# Patient Record
Sex: Female | Born: 1975 | Race: Black or African American | Hispanic: No | Marital: Single | Smoking: Current every day smoker
Health system: Southern US, Community
[De-identification: ages and names within clinical notes are randomized; demographics above are authoritative.]

## PROBLEM LIST (undated history)

## (undated) DIAGNOSIS — E282 Polycystic ovarian syndrome: Secondary | ICD-10-CM

## (undated) DIAGNOSIS — IMO0002 Reserved for concepts with insufficient information to code with codable children: Secondary | ICD-10-CM

## (undated) DIAGNOSIS — D649 Anemia, unspecified: Secondary | ICD-10-CM

## (undated) DIAGNOSIS — L309 Dermatitis, unspecified: Secondary | ICD-10-CM

## (undated) DIAGNOSIS — N920 Excessive and frequent menstruation with regular cycle: Secondary | ICD-10-CM

## (undated) DIAGNOSIS — L02416 Cutaneous abscess of left lower limb: Secondary | ICD-10-CM

## (undated) DIAGNOSIS — N946 Dysmenorrhea, unspecified: Secondary | ICD-10-CM

## (undated) DIAGNOSIS — D500 Iron deficiency anemia secondary to blood loss (chronic): Secondary | ICD-10-CM

## (undated) DIAGNOSIS — K219 Gastro-esophageal reflux disease without esophagitis: Secondary | ICD-10-CM

## (undated) DIAGNOSIS — Z789 Other specified health status: Secondary | ICD-10-CM

## (undated) DIAGNOSIS — D259 Leiomyoma of uterus, unspecified: Secondary | ICD-10-CM

---

## 2005-11-01 ENCOUNTER — Emergency Department (HOSPITAL_COMMUNITY): Admission: EM | Admit: 2005-11-01 | Discharge: 2005-11-01 | Payer: Self-pay | Admitting: Family Medicine

## 2006-07-04 ENCOUNTER — Emergency Department (HOSPITAL_COMMUNITY): Admission: EM | Admit: 2006-07-04 | Discharge: 2006-07-04 | Payer: Self-pay | Admitting: Family Medicine

## 2007-01-30 ENCOUNTER — Ambulatory Visit (HOSPITAL_COMMUNITY): Admission: RE | Admit: 2007-01-30 | Discharge: 2007-01-30 | Payer: Self-pay | Admitting: *Deleted

## 2007-01-30 ENCOUNTER — Encounter (INDEPENDENT_AMBULATORY_CARE_PROVIDER_SITE_OTHER): Payer: Self-pay | Admitting: *Deleted

## 2007-01-30 HISTORY — PX: HYSTEROSCOPY WITH D & C: SHX1775

## 2007-07-27 HISTORY — PX: DILATION AND CURETTAGE OF UTERUS: SHX78

## 2008-01-02 ENCOUNTER — Other Ambulatory Visit: Admission: RE | Admit: 2008-01-02 | Discharge: 2008-01-02 | Payer: Self-pay | Admitting: Gynecology

## 2008-02-13 ENCOUNTER — Emergency Department (HOSPITAL_COMMUNITY): Admission: EM | Admit: 2008-02-13 | Discharge: 2008-02-13 | Payer: Self-pay | Admitting: Emergency Medicine

## 2008-10-21 ENCOUNTER — Emergency Department (HOSPITAL_COMMUNITY): Admission: EM | Admit: 2008-10-21 | Discharge: 2008-10-21 | Payer: Self-pay | Admitting: Family Medicine

## 2009-07-16 ENCOUNTER — Emergency Department (HOSPITAL_COMMUNITY): Admission: EM | Admit: 2009-07-16 | Discharge: 2009-07-16 | Payer: Self-pay | Admitting: Family Medicine

## 2010-07-26 HISTORY — PX: UTERINE FIBROID SURGERY: SHX826

## 2010-10-26 LAB — POCT I-STAT, CHEM 8
Calcium, Ion: 1.22 mmol/L (ref 1.12–1.32)
Glucose, Bld: 88 mg/dL (ref 70–99)
HCT: 36 % (ref 36.0–46.0)
Hemoglobin: 12.2 g/dL (ref 12.0–15.0)
Potassium: 3.7 mEq/L (ref 3.5–5.1)

## 2010-10-26 LAB — DIFFERENTIAL
Basophils Relative: 0 % (ref 0–1)
Eosinophils Absolute: 0.1 10*3/uL (ref 0.0–0.7)
Monocytes Relative: 6 % (ref 3–12)
Neutrophils Relative %: 63 % (ref 43–77)

## 2010-10-26 LAB — CBC
HCT: 32.3 % — ABNORMAL LOW (ref 36.0–46.0)
Hemoglobin: 10.2 g/dL — ABNORMAL LOW (ref 12.0–15.0)
MCHC: 31.4 g/dL (ref 30.0–36.0)
MCV: 70.2 fL — ABNORMAL LOW (ref 78.0–100.0)
Platelets: 396 K/uL (ref 150–400)
RBC: 4.61 MIL/uL (ref 3.87–5.11)
RDW: 20.3 % — ABNORMAL HIGH (ref 11.5–15.5)
WBC: 8.6 K/uL (ref 4.0–10.5)

## 2010-10-26 LAB — POCT URINALYSIS DIP (DEVICE)
Glucose, UA: NEGATIVE mg/dL
Nitrite: NEGATIVE
Protein, ur: 30 mg/dL — AB
Specific Gravity, Urine: 1.015 (ref 1.005–1.030)
Urobilinogen, UA: 0.2 mg/dL (ref 0.0–1.0)
pH: 6 (ref 5.0–8.0)

## 2010-11-05 LAB — POCT URINALYSIS DIP (DEVICE)
Bilirubin Urine: NEGATIVE
Nitrite: NEGATIVE
Protein, ur: NEGATIVE mg/dL
pH: 7 (ref 5.0–8.0)

## 2010-12-08 NOTE — Op Note (Signed)
NAME:  Sally Price, Sally Price            ACCOUNT NO.:  [id number]   MEDICAL RECORD NO.:  [id number]          PATIENT TYPE:  AMB   LOCATION:  DAY                          FACILITY:  [facility] [place]   PHYSICIAN:  Almedia Balls. Fore, M.D.   DATE OF BIRTH:  [birth year]   DATE OF PROCEDURE:  01/30/2007  DATE OF DISCHARGE:                               OPERATIVE REPORT   PREOPERATIVE DIAGNOSIS:  Abnormal uterine bleeding.   POSTOPERATIVE DIAGNOSIS:  Abnormal uterine bleeding, pending pathology.   OPERATION:  Diagnostic hysteroscopy, fractional D&C.   ANESTHESIA:  General orotracheal, with 10 mL 1% lidocaine with 1:100,000  epinephrine paracervical block.   INDICATIONS FOR SURGERY:  The patient is a 35 year old with persistent  abnormal uterine bleeding over the past 6 months.  She presented in our  office today with bleeding and a grayish mass protruding through the  cervix.  Because of her discomfort, it was felt that she should undergo  D&C.  She was fully counseled as to the nature of the procedure and the  risks involved to include risks of anesthesia, injury to the uterus,  tubes, ovaries, bowel, bladder blood vessels, ureters, postoperative  hemorrhage, infection, and recuperation.  She fully understands all  these considerations and has signed informed consent to proceed on January 30, 2007.   OPERATIVE FINDINGS:  On placement of the speculum, there was a grayish,  somewhat friable, soft mass protruding through the cervix which probably  represented old blood clot.  The cervix was dilated to approximately 1.5  cm.  The endocervical canal was clean.  The uterus sounded to 8.5 cm.  Endometrial cavity had numerous areas of shaggy-appearing endometrium  present.  On bimanual exam, the uterus was midposterior, approximately [redacted]  weeks gestational size.  There were no palpable adnexal masses.   PROCEDURE:  With the patient under general anesthesia, prepared and  draped in th usual sterile fashion, a speculum  was placed in the vagina.  A single-tooth tenaculum was placed on the cervix, and a solution of 1%  lidocaine with 1:100,000 epinephrine was injected circumferentially on  the cervix for paracervical block.  Small sharp curette was used for  curettage of the endocervical canal after the protruding mass was  removed.  The uterus was then sounded as noted above, and because no  dilation of the cervix was necessary, the hysteroscope was introduced  using free flow of [place] and direct vision, with the above-noted  findings.  The hysteroscope was then removed, and a medium sharp curette  and polyp forceps were used for removal of tissue from the endometrial  cavity.  Hysteroscope was re-employed to ensure that all tissue had been  removed. After noting that this was the case, and hemostasis was  maintained, and the sponge and instrument counts were correct, the  procedure was terminated.  Estimated blood loss was approximately 25 mL.  The patient was taken to the recovery room in good condition.   FOLLOW-UP CARE:  She is to return the office in 2 weeks for followup,  and to call if heavy bleeding, pain, or unexplained fever should ensue.  She was given a prescription for Darvocet-N 100 generic, #10, to be  taken 1/2 to 1 q.6 h. p.r.n. pain, and doxycycline 100 mg, #12, to be  taken 1 b.i.d..           ______________________________  Almedia Balls Randell Patient, M.D.     SRF/MEDQ  D:  01/30/2007  T:  01/31/2007  Job:  [id number]   cc:   Leatha Gilding. Mezer, M.D.  Fax: [phone]

## 2011-01-11 ENCOUNTER — Inpatient Hospital Stay (INDEPENDENT_AMBULATORY_CARE_PROVIDER_SITE_OTHER)
Admission: RE | Admit: 2011-01-11 | Discharge: 2011-01-11 | Disposition: A | Discharge status: Home / Self Care | Payer: 59 | Source: Ambulatory Visit | Attending: Emergency Medicine | Admitting: Emergency Medicine

## 2011-01-11 DIAGNOSIS — H60399 Other infective otitis externa, unspecified ear: Secondary | ICD-10-CM

## 2011-01-13 LAB — CULTURE, ROUTINE-ABSCESS

## 2011-04-23 LAB — POCT URINALYSIS DIP (DEVICE)
Glucose, UA: NEGATIVE
Nitrite: POSITIVE — AB
Operator id: 247071
Urobilinogen, UA: 0.2

## 2011-04-23 LAB — POCT PREGNANCY, URINE: Operator id: 247071

## 2011-04-23 LAB — URINE CULTURE

## 2011-04-27 ENCOUNTER — Encounter (HOSPITAL_COMMUNITY): Payer: Self-pay | Admitting: *Deleted

## 2011-05-03 ENCOUNTER — Other Ambulatory Visit: Payer: Self-pay | Admitting: Obstetrics and Gynecology

## 2011-05-11 LAB — HEMOGLOBIN AND HEMATOCRIT, BLOOD
HCT: 34.9 — ABNORMAL LOW
Hemoglobin: 11.5 — ABNORMAL LOW

## 2011-05-12 NOTE — H&P (Signed)
NAME:  Sally Price, Sally Price NO.:  [id number]  MEDICAL RECORD NO.:  [id number]  LOCATION:  URG                          FACILITY:  [facility]  PHYSICIAN:  Lenoard Aden, M.D.DATE OF BIRTH:  1976/04/10  DATE OF ADMISSION:  01/11/2011 DATE OF DISCHARGE:  01/11/2011                             HISTORY & PHYSICAL   CHIEF COMPLAINT:  Dysfunctional uterine bleeding with structural endometrial lesion.  HISTORY OF PRESENT ILLNESS:  She is a 35 year old African American female, G0, P0 with irregular uterine bleeding and questionable submucous fibroid versus polyp on saline sonohysterography.  ALLERGIES:  She has no known drug allergies.  MEDICATIONS:  Birth control pills.  FAMILY HISTORY:  Myocardial infarction, heart failure, diabetes, and hypertension.  SOCIAL HISTORY:  A cigarette per day smoker.  Nondrinker and denies domestic or physical violence.  PAST SURGICAL HISTORY:  D and C in 2009.  PHYSICAL EXAMINATION:  GENERAL:  Well-developed, well-nourished African American female in no acute distress. HEENT:  Normal. NECK:  Supple.  Full range of motion. LUNGS:  Clear. HEART:  Regular rhythm. ABDOMEN:  Soft, obese, nontender. PELVIC:  A retroverted uterus and no adnexal masses.  IMPRESSION:  Dysfunctional uterine bleeding with structural lesion.  PLAN:  Proceed with diagnostic hysteroscopy D and C, possible risk for resection, risks of anesthesia, infection, bleeding, injury to abdominal organs, need for repair was discussed.  Delayed versus immediate complications to include bowel and bladder injury noted.  Possibility and/or inability to cure all bleeding at this time.  The patient acknowledges and wishes to proceed.     Lenoard Aden, M.D.     RJT/MEDQ  D:  05/12/2011  T:  05/12/2011  Job:  [id number]

## 2011-05-13 ENCOUNTER — Encounter (HOSPITAL_COMMUNITY): Payer: Self-pay | Admitting: Anesthesiology

## 2011-05-13 ENCOUNTER — Encounter (HOSPITAL_COMMUNITY): Payer: Self-pay | Admitting: *Deleted

## 2011-05-13 ENCOUNTER — Ambulatory Visit (HOSPITAL_COMMUNITY): Payer: 59 | Admitting: Anesthesiology

## 2011-05-13 ENCOUNTER — Encounter (HOSPITAL_COMMUNITY)
Admission: RE | Disposition: A | Discharge status: Home / Self Care | Payer: Self-pay | Source: Ambulatory Visit | Attending: Obstetrics and Gynecology

## 2011-05-13 ENCOUNTER — Ambulatory Visit (HOSPITAL_COMMUNITY)
Admission: RE | Admit: 2011-05-13 | Discharge: 2011-05-13 | Disposition: A | Discharge status: Home / Self Care | Payer: 59 | Source: Ambulatory Visit | Attending: Obstetrics and Gynecology | Admitting: Obstetrics and Gynecology

## 2011-05-13 ENCOUNTER — Other Ambulatory Visit: Payer: Self-pay | Admitting: Obstetrics and Gynecology

## 2011-05-13 DIAGNOSIS — N938 Other specified abnormal uterine and vaginal bleeding: Secondary | ICD-10-CM | POA: Insufficient documentation

## 2011-05-13 DIAGNOSIS — N840 Polyp of corpus uteri: Secondary | ICD-10-CM | POA: Insufficient documentation

## 2011-05-13 DIAGNOSIS — N949 Unspecified condition associated with female genital organs and menstrual cycle: Secondary | ICD-10-CM | POA: Insufficient documentation

## 2011-05-13 HISTORY — DX: Other specified health status: Z78.9

## 2011-05-13 HISTORY — PX: HYSTEROSCOPY WITH D & C: SHX1775

## 2011-05-13 LAB — HCG, SERUM, QUALITATIVE: Preg, Serum: NEGATIVE

## 2011-05-13 LAB — CBC
Hemoglobin: 11.2 g/dL — ABNORMAL LOW (ref 12.0–15.0)
MCH: 23.3 pg — ABNORMAL LOW (ref 26.0–34.0)
MCHC: 30.3 g/dL (ref 30.0–36.0)

## 2011-05-13 SURGERY — DILATATION AND CURETTAGE /HYSTEROSCOPY
Anesthesia: General | Site: Vagina | Wound class: Clean Contaminated

## 2011-05-13 MED ORDER — FENTANYL CITRATE 0.05 MG/ML IJ SOLN
25.0000 ug | INTRAMUSCULAR | Status: DC | PRN
  Administered 2011-05-13: 50 ug via INTRAVENOUS

## 2011-05-13 MED ORDER — LACTATED RINGERS IV SOLN
INTRAVENOUS | Status: DC
  Administered 2011-05-13 (×2): via INTRAVENOUS

## 2011-05-13 MED ORDER — FENTANYL CITRATE 0.05 MG/ML IJ SOLN
INTRAMUSCULAR | Status: DC | PRN
  Administered 2011-05-13: 100 ug via INTRAVENOUS

## 2011-05-13 MED ORDER — DEXAMETHASONE SODIUM PHOSPHATE 10 MG/ML IJ SOLN
INTRAMUSCULAR | Status: DC | PRN
  Administered 2011-05-13: 10 mg via INTRAVENOUS

## 2011-05-13 MED ORDER — MIDAZOLAM HCL 5 MG/5ML IJ SOLN
INTRAMUSCULAR | Status: DC | PRN
  Administered 2011-05-13: 2 mg via INTRAVENOUS

## 2011-05-13 MED ORDER — BUPIVACAINE HCL (PF) 0.25 % IJ SOLN
INTRAMUSCULAR | Status: DC | PRN
  Administered 2011-05-13: 20 mL

## 2011-05-13 MED ORDER — FENTANYL CITRATE 0.05 MG/ML IJ SOLN
INTRAMUSCULAR | Status: AC
  Filled 2011-05-13: qty 2

## 2011-05-13 MED ORDER — VASOPRESSIN 20 UNIT/ML IJ SOLN
INTRAVENOUS | Status: DC | PRN
  Administered 2011-05-13: 10:00:00 via INTRAMUSCULAR

## 2011-05-13 MED ORDER — KETOROLAC TROMETHAMINE 30 MG/ML IJ SOLN: 15.0000 mg | Freq: Once | INTRAMUSCULAR | Status: DC | PRN

## 2011-05-13 MED ORDER — FENTANYL CITRATE 0.05 MG/ML IJ SOLN
INTRAMUSCULAR | Status: AC
  Administered 2011-05-13: 50 ug via INTRAVENOUS
  Filled 2011-05-13: qty 2

## 2011-05-13 MED ORDER — DEXAMETHASONE SODIUM PHOSPHATE 10 MG/ML IJ SOLN
INTRAMUSCULAR | Status: AC
  Filled 2011-05-13: qty 1

## 2011-05-13 MED ORDER — LIDOCAINE HCL (CARDIAC) 20 MG/ML IV SOLN
INTRAVENOUS | Status: AC
  Filled 2011-05-13: qty 5

## 2011-05-13 MED ORDER — TRAMADOL HCL 50 MG PO TABS: 50.0000 mg | ORAL_TABLET | Freq: Four times a day (QID) | ORAL | Status: AC | PRN

## 2011-05-13 MED ORDER — HYDROCODONE-ACETAMINOPHEN 5-325 MG PO TABS
1.0000 | ORAL_TABLET | Freq: Once | ORAL | Status: AC
  Administered 2011-05-13: 1 via ORAL

## 2011-05-13 MED ORDER — KETOROLAC TROMETHAMINE 30 MG/ML IJ SOLN
INTRAMUSCULAR | Status: AC
  Filled 2011-05-13: qty 1

## 2011-05-13 MED ORDER — ONDANSETRON HCL 4 MG/2ML IJ SOLN
INTRAMUSCULAR | Status: DC | PRN
  Administered 2011-05-13: 4 mg via INTRAVENOUS

## 2011-05-13 MED ORDER — LIDOCAINE HCL (CARDIAC) 20 MG/ML IV SOLN
INTRAVENOUS | Status: DC | PRN
  Administered 2011-05-13: 80 mg via INTRAVENOUS

## 2011-05-13 MED ORDER — KETOROLAC TROMETHAMINE 30 MG/ML IJ SOLN
INTRAMUSCULAR | Status: DC | PRN
  Administered 2011-05-13: 30 mg via INTRAVENOUS

## 2011-05-13 MED ORDER — MIDAZOLAM HCL 2 MG/2ML IJ SOLN
INTRAMUSCULAR | Status: AC
  Filled 2011-05-13: qty 2

## 2011-05-13 MED ORDER — PROPOFOL 10 MG/ML IV EMUL
INTRAVENOUS | Status: DC | PRN
  Administered 2011-05-13: 200 mg via INTRAVENOUS

## 2011-05-13 MED ORDER — PROPOFOL 10 MG/ML IV EMUL
INTRAVENOUS | Status: AC
  Filled 2011-05-13: qty 20

## 2011-05-13 MED ORDER — HYDROCODONE-ACETAMINOPHEN 5-325 MG PO TABS
ORAL_TABLET | ORAL | Status: AC
  Filled 2011-05-13: qty 1

## 2011-05-13 MED ORDER — ONDANSETRON HCL 4 MG/2ML IJ SOLN
INTRAMUSCULAR | Status: AC
  Filled 2011-05-13: qty 2

## 2011-05-13 MED ORDER — SODIUM CHLORIDE 0.9 % IR SOLN
Status: DC | PRN
  Administered 2011-05-13: 3000 mL

## 2011-05-13 SURGICAL SUPPLY — 13 items
CANISTER SUCTION 2500CC (MISCELLANEOUS) ×3 IMPLANT
CATH ROBINSON RED A/P 16FR (CATHETERS) ×3 IMPLANT
CLOTH BEACON ORANGE TIMEOUT ST (SAFETY) ×3 IMPLANT
CONTAINER PREFILL 10% NBF 60ML (FORM) ×4 IMPLANT
DRAPE UTILITY XL STRL (DRAPES) ×3 IMPLANT
ELECTRODE RT ANGLE VERSAPOINT (CUTTING LOOP) ×3 IMPLANT
GLOVE BIO SURGEON STRL SZ7.5 (GLOVE) ×6 IMPLANT
GOWN STRL NON-REIN LRG LVL3 (GOWN DISPOSABLE) ×3 IMPLANT
GOWN STRL REIN XL XLG (GOWN DISPOSABLE) ×3 IMPLANT
PACK HYSTEROSCOPY LF (CUSTOM PROCEDURE TRAY) ×3 IMPLANT
SYR TB 1ML 25GX5/8 (SYRINGE) ×3 IMPLANT
TOWEL OR 17X24 6PK STRL BLUE (TOWEL DISPOSABLE) ×6 IMPLANT
WATER STERILE IRR 1000ML POUR (IV SOLUTION) ×1 IMPLANT

## 2011-05-13 NOTE — Anesthesia Preprocedure Evaluation (Signed)
Anesthesia Evaluation  Name, MR# and DOB Patient awake  General Assessment Comment  Reviewed: Allergy & Precautions, H&P , NPO status , Patient's Chart, lab work & pertinent test results, reviewed documented beta blocker date and time   History of Anesthesia Complications Negative for: history of anesthetic complications  Airway Mallampati: I      Dental  (+) Teeth Intact   Pulmonary Current Smoker  clear to auscultation  Pulmonary exam normal       Cardiovascular Exercise Tolerance: Good regular Normal    Neuro/Psych Negative Neurological ROS  Negative Psych ROS   GI/Hepatic Neg liver ROS  Recent GI bug with nausea and diarrhea.  No fever or vomiting.   Endo/Other  Negative Endocrine ROS  Renal/GU negative Renal ROS     Musculoskeletal   Abdominal   Peds  Hematology negative hematology ROS (+)   Anesthesia Other Findings   Reproductive/Obstetrics negative OB ROS                           Anesthesia Physical Anesthesia Plan  ASA: II  Anesthesia Plan: General   Post-op Pain Management:    Induction:   Airway Management Planned: LMA  Additional Equipment:   Intra-op Plan:   Post-operative Plan:   Informed Consent: I have reviewed the patients History and Physical, chart, labs and discussed the procedure including the risks, benefits and alternatives for the proposed anesthesia with the patient or authorized representative who has indicated his/her understanding and acceptance.   Dental Advisory Given  Plan Discussed with: CRNA and Surgeon  Anesthesia Plan Comments:         Anesthesia Quick Evaluation

## 2011-05-13 NOTE — Anesthesia Postprocedure Evaluation (Signed)
Anesthesia Post Note  Patient: Sally Price  Procedure(s) Performed:  DILATATION AND CURETTAGE (D&C) /HYSTEROSCOPY  Anesthesia type: General  Patient location: PACU  Post pain: Pain level controlled  Post assessment: Post-op Vital signs reviewed  Last Vitals:  Filed Vitals:   05/13/11 1130  BP: 129/71  Pulse: 89  Temp:   Resp:     Post vital signs: Reviewed  Level of consciousness: sedated  Complications: No apparent anesthesia complications

## 2011-05-13 NOTE — Transfer of Care (Signed)
Immediate Anesthesia Transfer of Care Note  Patient: Sally Price  Procedure(s) Performed:  DILATATION & CURETTAGE/HYSTEROSCOPY WITH VERSAPOINT RESECTION  Patient Location: PACU  Anesthesia Type: General  Level of Consciousness: alert , oriented and sedated  Airway & Oxygen Therapy: Patient Spontanous Breathing and Patient connected to nasal cannula oxygen  Post-op Assessment: Report given to PACU RN and Post -op Vital signs reviewed and stable  Post vital signs: stable  Complications: No apparent anesthesia complications

## 2011-05-13 NOTE — Op Note (Signed)
05/13/2011  10:41 AM  PATIENT:  Sally Price  35 y.o. female  PRE-OPERATIVE DIAGNOSIS:  Dysfunctional Uterine Bleeding  POST-OPERATIVE DIAGNOSIS:  Dysfunctional Uterine Bleeding  Endometrial polyp  PROCEDURE:  Procedure(s): DILATATION & CURETTAGE/HYSTEROSCOPY WITH VERSAPOINT RESECTION D&C  SURGEON:  Surgeon(s): Lenoard Aden, MD  ASSISTANTS: none   ANESTHESIA:   local and general  ESTIMATED BLOOD LOSS: * No blood loss amount entered *   DRAINS: none   LOCAL MEDICATIONS USED:  MARCAINE 20CC  SPECIMEN:  Source of Specimen:  EMC and polyp  DISPOSITION OF SPECIMEN:  PATHOLOGY  COUNTS:  YES  DICTATION #: [id number]  PLAN OF CARE: DC home  PATIENT DISPOSITION:  PACU - hemodynamically stable.

## 2011-05-13 NOTE — Op Note (Signed)
NAME:  AKEIRA, LAHM NO.:  [id number]  MEDICAL RECORD NO.:  [id number]  LOCATION:  [facility]                          FACILITY:  [facility]  PHYSICIAN:  Lenoard Aden, M.D.DATE OF BIRTH:  [birth year]  DATE OF PROCEDURE: DATE OF DISCHARGE:  05/13/2011                              OPERATIVE REPORT   DESCRIPTION OF PROCEDURE:  After being apprised of risks of anesthesia, infection, bleeding, injury to abdominal organs, need for repair, delayed versus immediate complications to include bowel and bladder injury, possible need for repair, failure risk to stop all bleeding noted, the patient was brought to the operating room after consent was signed.  She was prepped and draped in usual sterile fashion.  Her feet were placed in Yellofin stirrups.  She was catheterized until the bladder was empty.  After achieving adequate anesthesia, dilute Pitressin solution placed at 3 and 9 o'clock cervical vaginal junction, 20 mL total.  Dilute Marcaine solution placed in a standard paracervical block, 20 mL total.  Cervix was then easily dilated up to a #25 Pratt dilator.  Hysteroscope placed.  Visualization revealed multiple anterior and posterior wall endometrial polyps which were resected using a right- angle loop without difficulty.  Good hemostasis was noted.  Curetting was performed in the bilateral frontal areas gently to assure no evidence of further polyp or fibroid presence due to the evidence of a thickened endometrium which made visualization difficult in those two areas.  After endometrial curettings were collected, revisualization revealed a normal endometrial cavity and bilateral normal tubal ostia. Good hemostasis was noted.  Fluid deficit of 45 mL noted.  The patient tolerated the procedure well and was awakened and transferred to recovery in good condition.     Lenoard Aden, M.D.     RJT/MEDQ  D:  05/13/2011  T:  05/13/2011  Job:  [id number]

## 2011-05-13 NOTE — H&P (Signed)
NAME:  Sally Price, Sally Price            ACCOUNT NO.:  [id number]  MEDICAL RECORD NO.:  [id number]  LOCATION:                                 FACILITY:  PHYSICIAN:  Lenoard Aden, M.D.DATE OF BIRTH:  1975-12-08  DATE OF ADMISSION: DATE OF DISCHARGE:                             HISTORY & PHYSICAL   CHIEF COMPLAINT:  Dysfunctional uterine bleeding.  HISTORY OF PRESENT ILLNESS:  The patient is a 35 year old, African American female, G0, P0, who presents with dysfunctional uterine bleeding and structural lesion on saline sonohysterography.  She has no known drug allergies.  MEDICATIONS:  Birth control pills.  She is a nonsmoker.  She is a 5 cigarettes a day smoker.  She is a nondrinker.  She denies domestic or physical violence.  She was seen in 2009 for bleeding.  She has a noncontributory pregnancy history.  SURGICAL HISTORY:  Noncontributory.  FAMILY HISTORY:  Myocardial infarction, CHF, heart disease, diabetes, and hypertension.  PHYSICAL EXAMINATION:  VITAL SIGNS:  The patient is 5 feet 4 inches, weight of 200 pounds, blood pressure 134/70. HEENT:  Normal. NECK:  Supple.  Full range of motion. LUNGS:  Clear. HEART:  Regular rhythm. ABDOMEN:  Soft, nontender. PELVIC:  Uterus to be retroverted with no adnexal masses. EXTREMITIES:  There are no cords. NEUROLOGIC:  Nonfocal. SKIN:  Intact.  IMPRESSION:  Structural endometrial lesion, probable submucous fibroid for resection.  Plan is to proceed with diagnostic hysteroscopy, D and C, VersaPoint resection of probable submucous fibroid.  Risks of anesthesia, infection, bleeding, injury to abdominal organs with need for repair was discussed.  Delayed versus immediate complications to include bowel and bladder injury noted.  The patient acknowledges and wishes to proceed.     Lenoard Aden, M.D.     RJT/MEDQ  D:  05/12/2011  T:  05/13/2011  Job:  [id number]

## 2011-05-13 NOTE — Progress Notes (Signed)
  Update done. No changes noted. 

## 2011-05-14 ENCOUNTER — Encounter (HOSPITAL_COMMUNITY): Payer: Self-pay | Admitting: Obstetrics and Gynecology

## 2011-09-21 ENCOUNTER — Emergency Department (INDEPENDENT_AMBULATORY_CARE_PROVIDER_SITE_OTHER)
Admission: EM | Admit: 2011-09-21 | Discharge: 2011-09-21 | Disposition: A | Discharge status: Home / Self Care | Payer: 59 | Source: Home / Self Care | Attending: Emergency Medicine | Admitting: Emergency Medicine

## 2011-09-21 ENCOUNTER — Encounter (HOSPITAL_COMMUNITY): Payer: Self-pay | Admitting: Emergency Medicine

## 2011-09-21 DIAGNOSIS — L0291 Cutaneous abscess, unspecified: Secondary | ICD-10-CM

## 2011-09-21 DIAGNOSIS — L0390 Cellulitis, unspecified: Secondary | ICD-10-CM

## 2011-09-21 HISTORY — DX: Cutaneous abscess of left lower limb: L02.416

## 2011-09-21 MED ORDER — SULFAMETHOXAZOLE-TMP DS 800-160 MG PO TABS: 2.0000 | ORAL_TABLET | Freq: Two times a day (BID) | ORAL | Status: AC

## 2011-09-21 MED ORDER — TRAMADOL HCL 50 MG PO TABS: 100.0000 mg | ORAL_TABLET | Freq: Three times a day (TID) | ORAL | Status: AC | PRN

## 2011-09-21 MED ORDER — MUPIROCIN 2 % EX OINT: TOPICAL_OINTMENT | Freq: Three times a day (TID) | CUTANEOUS | Status: AC

## 2011-09-21 MED ORDER — CHLORHEXIDINE GLUCONATE 4 % EX LIQD: 60.0000 mL | Freq: Every day | CUTANEOUS | Status: AC | PRN

## 2011-09-21 NOTE — ED Notes (Signed)
Reports abscess behind left ear.  History of abscess in this ear .  Soreness and swelling around abscess and below site, swollen gland.

## 2011-09-21 NOTE — Discharge Instructions (Signed)

## 2011-09-21 NOTE — ED Provider Notes (Signed)
Chief Complaint  Patient presents with  . Abscess    History of Present Illness:   The patient has a large, painful abscess behind her left ear. This has been present since Saturday, 4 days ago. Has not drained any pus. She denies any fever or chills. She had an abscess inside her ear in August and drained but in full and she does not wish to have her abscess and done here at the urgent care Center. Shortly thereafter she developed a bump behind her ear, but only recently it's become painful and swollen.  Review of Systems:  Other than noted above, the patient denies any of the following symptoms: Systemic:  No fever, chills or sweats. Skin:  No rash or itching.  PMFSH:  Past medical history, family history, social history, meds, and allergies were reviewed.  Physical Exam:   Vital signs:  BP 146/78  Pulse 83  Temp(Src) 98.3 F (36.8 C) (Oral)  Resp 18  SpO2 99%  LMP 08/16/2011 Skin:  There is what appears to be a cyst behind the left ear and just at the level of the earlobe.,  Otherwise normal.  No rash.  I recommended that the patient have this incised and drained. She states she did not think she could tolerate this. I told her I would give her some antibiotics and have her use moist warm compresses. If no better I suggested she see an ear nose and throat surgeon to have it done under no anesthesia.   Assessment:   Diagnoses that have been ruled out:  None  Diagnoses that are still under consideration:  None  Final diagnoses:  Abscess    Plan:   1.  The following meds were prescribed:   New Prescriptions   CHLORHEXIDINE (HIBICLENS) 4 % EXTERNAL LIQUID    Apply 60 mLs (4 application total) topically daily as needed.   MUPIROCIN OINTMENT (BACTROBAN) 2 %    Apply topically 3 (three) times daily.   SULFAMETHOXAZOLE-TRIMETHOPRIM (BACTRIM DS) 800-160 MG PER TABLET    Take 2 tablets by mouth 2 (two) times daily.   TRAMADOL (ULTRAM) 50 MG TABLET    Take 2 tablets (100 mg total) by  mouth every 8 (eight) hours as needed for pain.   1.  The patient was instructed in symptomatic care and handouts were given. I gave her the name of Dr. Suzanna Obey to followup with for incision and drainage under conscious sedation.  Roque Lias, MD 09/21/11 [phone]

## 2012-02-16 ENCOUNTER — Emergency Department (HOSPITAL_COMMUNITY)
Admission: EM | Admit: 2012-02-16 | Discharge: 2012-02-16 | Disposition: A | Discharge status: Home / Self Care | Payer: 59 | Attending: Emergency Medicine | Admitting: Emergency Medicine

## 2012-02-16 ENCOUNTER — Encounter (HOSPITAL_COMMUNITY): Payer: Self-pay

## 2012-02-16 DIAGNOSIS — J039 Acute tonsillitis, unspecified: Secondary | ICD-10-CM

## 2012-02-16 DIAGNOSIS — F17200 Nicotine dependence, unspecified, uncomplicated: Secondary | ICD-10-CM | POA: Insufficient documentation

## 2012-02-16 MED ORDER — AMOXICILLIN 500 MG PO CAPS: 500.0000 mg | ORAL_CAPSULE | Freq: Three times a day (TID) | ORAL | Status: AC

## 2012-02-16 MED ORDER — MAGIC MOUTHWASH W/LIDOCAINE: 10.0000 mL | Freq: Three times a day (TID) | ORAL | Status: DC

## 2012-02-16 NOTE — ED Notes (Signed)
Pt sttaes developed sore throat with white spots on the right side yesterday AM when she awoke-states pain radiating to right jaw area and right ear-states pain and swelling to the back of her neck

## 2012-02-16 NOTE — ED Provider Notes (Signed)
History     CSN: [id number]  Arrival date & time 02/16/12  0235   First MD Initiated Contact with Patient 02/16/12 [phone]      Chief Complaint  Patient presents with  . Sore Throat   HPI  History provided by the patient. Patient is a 36 year old female with no significant past medical history who presents with complaints of sore throat. Symptoms began acutely 2 days ago. Patient states she also noticed white spots in the back of her throat. Pain is sharp and worse with swallowing. Patient has used Listerine and salt water gargle without any improvement. She denies any sick contacts. She denies any fever, chills, sweats, nausea vomiting. No cough, nasal congestion or rhinorrhea symptoms.    Past Medical History  Diagnosis Date  . No pertinent past medical history   . Abscess of left leg     Past Surgical History  Procedure Date  . Dilation and curettage of uterus 2009  . Hysteroscopy w/d&c 05/13/2011    Procedure: DILATATION AND CURETTAGE (D&C) /HYSTEROSCOPY;  Surgeon: Lenoard Aden, MD;  Location: [facility];  Service: Gynecology;;    No family history on file.  History  Substance Use Topics  . Smoking status: Current Everyday Smoker -- 0.5 packs/day for 10 years    Types: Cigarettes  . Smokeless tobacco: Not on file  . Alcohol Use: Yes     occasionally    OB History    Grav Para Term Preterm Abortions TAB SAB Ect Mult Living                  Review of Systems  Constitutional: Negative for fever and chills.  HENT: Positive for sore throat. Negative for congestion and rhinorrhea.   Respiratory: Negative for cough.   Gastrointestinal: Negative for nausea and vomiting.    Allergies  Review of patient's allergies indicates no known allergies.  Home Medications   Current Outpatient Rx  Name Route Sig Dispense Refill  . NORETHIN ACE-ETH ESTRAD-FE 1-20 MG-MCG(24) PO TABS Oral Take 1 tablet by mouth daily.        BP 118/68  Pulse 84  Temp 98.4 F (36.9 C)  (Oral)  Resp 18  Ht 5\' 3"  (1.6 m)  Wt 200 lb (90.719 kg)  BMI 35.43 kg/m2  SpO2 100%  Physical Exam  Nursing note and vitals reviewed. Constitutional: She is oriented to person, place, and time. She appears well-developed and well-nourished. No distress.  HENT:  Head: Normocephalic.       Mild diffuse erythema of pharynx. There is white exudate to the right tonsillar area. Uvula midline. No signs for peritonsillar abscess.  Neck: Normal range of motion. Neck supple.  Cardiovascular: Normal rate and regular rhythm.   Pulmonary/Chest: Effort normal and breath sounds normal.  Lymphadenopathy:    She has cervical adenopathy.  Neurological: She is alert and oriented to person, place, and time.  Skin: Skin is warm and dry. No rash noted.  Psychiatric: She has a normal mood and affect. Her behavior is normal.    ED Course  Procedures    Labs Reviewed  RAPID STREP SCREEN     1. Tonsillitis       MDM  Patient seen and evaluated. Patient with slight exudate is greatest on the right side tonsil area. Strep test negative. Will still consider antibiotics to prevent bacterial source of infection. We'll also provide Magic mouthwash for comfort.        Angus Seller, [place] 02/16/12 [phone]

## 2012-02-16 NOTE — ED Provider Notes (Signed)
Medical screening examination/treatment/procedure(s) were performed by non-physician practitioner and as supervising physician I was immediately available for consultation/collaboration.  Markice Torbert M Miken Stecher, MD 02/16/12 0855 

## 2012-02-16 NOTE — ED Notes (Signed)
Discharge instructions reviewed w/ pt., verbalizes understanding. Two prescriptions provided at discharge. 

## 2012-04-16 ENCOUNTER — Encounter (HOSPITAL_COMMUNITY): Payer: Self-pay | Admitting: *Deleted

## 2012-04-16 ENCOUNTER — Emergency Department (HOSPITAL_COMMUNITY)
Admission: EM | Admit: 2012-04-16 | Discharge: 2012-04-16 | Discharge status: Against Medical Advice | Payer: Self-pay | Attending: Emergency Medicine | Admitting: Emergency Medicine

## 2012-04-16 DIAGNOSIS — N898 Other specified noninflammatory disorders of vagina: Secondary | ICD-10-CM | POA: Insufficient documentation

## 2012-04-16 DIAGNOSIS — R109 Unspecified abdominal pain: Secondary | ICD-10-CM | POA: Insufficient documentation

## 2012-04-16 LAB — URINALYSIS, ROUTINE W REFLEX MICROSCOPIC
Bilirubin Urine: NEGATIVE
Hgb urine dipstick: NEGATIVE
Protein, ur: NEGATIVE mg/dL
Urobilinogen, UA: 0.2 mg/dL (ref 0.0–1.0)

## 2012-04-16 NOTE — ED Notes (Signed)
abd pain since Thursday, small amt of vag discharge, heavy feeling in stomach

## 2012-12-24 DIAGNOSIS — IMO0002 Reserved for concepts with insufficient information to code with codable children: Secondary | ICD-10-CM

## 2012-12-24 DIAGNOSIS — Z8742 Personal history of other diseases of the female genital tract: Secondary | ICD-10-CM

## 2012-12-24 HISTORY — DX: Reserved for concepts with insufficient information to code with codable children: IMO0002

## 2012-12-24 HISTORY — DX: Personal history of other diseases of the female genital tract: Z87.42

## 2012-12-26 ENCOUNTER — Other Ambulatory Visit: Payer: Self-pay | Admitting: Nurse Practitioner

## 2012-12-26 ENCOUNTER — Other Ambulatory Visit (HOSPITAL_COMMUNITY)
Admission: RE | Admit: 2012-12-26 | Discharge: 2012-12-26 | Disposition: A | Discharge status: Home / Self Care | Payer: 59 | Source: Ambulatory Visit | Attending: Obstetrics and Gynecology | Admitting: Obstetrics and Gynecology

## 2012-12-26 DIAGNOSIS — Z113 Encounter for screening for infections with a predominantly sexual mode of transmission: Secondary | ICD-10-CM | POA: Insufficient documentation

## 2012-12-26 DIAGNOSIS — Z01419 Encounter for gynecological examination (general) (routine) without abnormal findings: Secondary | ICD-10-CM | POA: Insufficient documentation

## 2012-12-26 DIAGNOSIS — Z1151 Encounter for screening for human papillomavirus (HPV): Secondary | ICD-10-CM | POA: Insufficient documentation

## 2013-05-22 ENCOUNTER — Encounter (HOSPITAL_COMMUNITY): Payer: Self-pay

## 2013-05-22 ENCOUNTER — Inpatient Hospital Stay (HOSPITAL_COMMUNITY)
Admission: AD | Admit: 2013-05-22 | Discharge: 2013-05-22 | Disposition: A | Discharge status: Home / Self Care | Payer: 59 | Source: Ambulatory Visit | Attending: Obstetrics and Gynecology | Admitting: Obstetrics and Gynecology

## 2013-05-22 DIAGNOSIS — N764 Abscess of vulva: Secondary | ICD-10-CM | POA: Insufficient documentation

## 2013-05-22 DIAGNOSIS — L293 Anogenital pruritus, unspecified: Secondary | ICD-10-CM | POA: Insufficient documentation

## 2013-05-22 DIAGNOSIS — L0291 Cutaneous abscess, unspecified: Secondary | ICD-10-CM

## 2013-05-22 HISTORY — DX: Reserved for concepts with insufficient information to code with codable children: IMO0002

## 2013-05-22 HISTORY — DX: Polycystic ovarian syndrome: E28.2

## 2013-05-22 LAB — URINE MICROSCOPIC-ADD ON

## 2013-05-22 LAB — URINALYSIS, ROUTINE W REFLEX MICROSCOPIC
Bilirubin Urine: NEGATIVE
Glucose, UA: NEGATIVE mg/dL
Ketones, ur: NEGATIVE mg/dL
pH: 6 (ref 5.0–8.0)

## 2013-05-22 MED ORDER — CEPHALEXIN 500 MG PO CAPS: 500.0000 mg | ORAL_CAPSULE | Freq: Four times a day (QID) | ORAL | Status: DC

## 2013-05-22 NOTE — MAU Provider Note (Signed)
History     CSN: [id number]  Arrival date and time: 05/22/13 1933   None     Chief Complaint  Patient presents with  . Recurrent Skin Infections   HPI This is a 37 y.o. female (nonpregnant) who presents with c/o having a small area on her left labia which is itchy and sometimes painful if she presses on it. 2 weeks ago had a "boil" there and since she has a long history of them, she self-treated with warm compresses until it opened up on its own. She noted that it almost healed completely but is still pink and sometimes tender. She was not sure if there was something wrong, that it was not healing completely. She feels a little bump underneath the skin. No vaginal issues or other symptoms.   RN Note: Boil on inside of vagina. Applied warm compress & it drained but is still painful and red. Area is somewhat "itchy" as well. Denies vaginal bleeding or discharge. LMP 01/17/13. Stopped taking BCP in June.       OB History   Grav Para Term Preterm Abortions TAB SAB Ect Mult Living   0 0 0 0 0 0 0 0 0 0       Past Medical History  Diagnosis Date  . No pertinent past medical history   . Abscess of left leg   . PCOS (polycystic ovarian syndrome)   . Abnormal Pap smear 12/2012    Past Surgical History  Procedure Laterality Date  . Hysteroscopy w/d&c  05/13/2011    Procedure: DILATATION AND CURETTAGE (D&C) /HYSTEROSCOPY;  Surgeon: Lenoard Aden, MD;  Location: [facility];  Service: Gynecology;;  . Dilation and curettage of uterus  2009  . Uterine fibroid surgery  2012    History reviewed. No pertinent family history.  History  Substance Use Topics  . Smoking status: Current Every Day Smoker -- 0.50 packs/day for 10 years    Types: Cigarettes  . Smokeless tobacco: Not on file  . Alcohol Use: Yes     Comment: occasionally    Allergies: No Known Allergies  Prescriptions prior to admission  Medication Sig Dispense Refill  . Multiple Vitamins-Minerals (MULTIVITAMIN WITH  MINERALS) tablet Take 1 tablet by mouth daily.        Review of Systems  Constitutional: Negative for fever, chills and malaise/fatigue.  Gastrointestinal: Negative for nausea, vomiting and abdominal pain.  Genitourinary:       Pink area on left labia, tender at times    Physical Exam   Blood pressure 130/77, pulse 84, temperature 99.5 F (37.5 C), temperature source Oral, resp. rate 20, height 5\' 3"  (1.6 m), weight 87.272 kg (192 lb 6.4 oz), last menstrual period 01/17/2013, SpO2 99.00%.  Physical Exam  Constitutional: She is oriented to person, place, and time. She appears well-developed and well-nourished. No distress.  Cardiovascular: Normal rate.   Respiratory: Effort normal.  Genitourinary: Vagina normal.    No vaginal discharge found.  On left labia, where labia majora joins the minora, there is a 1/2 cm round depigmented area (light pink vs dark brown surrounding skin) with no lesion or opening in skin. No erethema. On palpation there is a tiny , 1mm nodule under the skin, tender to touch.  No fluctuance.   Musculoskeletal: Normal range of motion.  Neurological: She is alert and oriented to person, place, and time.  Skin: Skin is warm and dry.  Psychiatric: She has a normal mood and affect.    MAU Course  Procedures  MDM Discussed with Dr Su Hilt  Assessment and Plan  A:  Healing labial abscess      Tiny nodule, subcutaneous  P:  Discharge home       Rx Keflex for coverage of any remaining infection       Followup with Dr Gordy Councilman 05/22/2013, 9:23 PM

## 2013-05-22 NOTE — MAU Note (Signed)
Boil on inside of vagina. Applied warm compress & it drained but is still painful and red. Area is somewhat "itchy" as well. Denies vaginal bleeding or discharge. LMP 01/17/13. Stopped taking BCP in June.

## 2013-05-24 LAB — URINE CULTURE

## 2013-06-03 ENCOUNTER — Encounter (HOSPITAL_COMMUNITY): Payer: Self-pay | Admitting: Emergency Medicine

## 2013-06-03 ENCOUNTER — Emergency Department (INDEPENDENT_AMBULATORY_CARE_PROVIDER_SITE_OTHER): Payer: 59

## 2013-06-03 ENCOUNTER — Emergency Department (INDEPENDENT_AMBULATORY_CARE_PROVIDER_SITE_OTHER)
Admission: EM | Admit: 2013-06-03 | Discharge: 2013-06-03 | Disposition: A | Discharge status: Home / Self Care | Payer: 59 | Source: Home / Self Care | Attending: Family Medicine | Admitting: Family Medicine

## 2013-06-03 DIAGNOSIS — J45901 Unspecified asthma with (acute) exacerbation: Secondary | ICD-10-CM

## 2013-06-03 DIAGNOSIS — J4531 Mild persistent asthma with (acute) exacerbation: Secondary | ICD-10-CM

## 2013-06-03 MED ORDER — METHYLPREDNISOLONE 4 MG PO KIT: PACK | ORAL | Status: DC

## 2013-06-03 MED ORDER — ALBUTEROL SULFATE (5 MG/ML) 0.5% IN NEBU
INHALATION_SOLUTION | RESPIRATORY_TRACT | Status: AC
  Filled 2013-06-03: qty 0.5

## 2013-06-03 MED ORDER — METHYLPREDNISOLONE SODIUM SUCC 125 MG IJ SOLR
125.0000 mg | Freq: Once | INTRAMUSCULAR | Status: AC
  Administered 2013-06-03: 125 mg via INTRAMUSCULAR

## 2013-06-03 MED ORDER — IPRATROPIUM BROMIDE 0.02 % IN SOLN
0.5000 mg | Freq: Once | RESPIRATORY_TRACT | Status: AC
  Administered 2013-06-03: 0.5 mg via RESPIRATORY_TRACT

## 2013-06-03 MED ORDER — IPRATROPIUM BROMIDE 0.02 % IN SOLN
RESPIRATORY_TRACT | Status: AC
  Filled 2013-06-03: qty 2.5

## 2013-06-03 MED ORDER — ALBUTEROL SULFATE (5 MG/ML) 0.5% IN NEBU
5.0000 mg | INHALATION_SOLUTION | Freq: Once | RESPIRATORY_TRACT | Status: AC
  Administered 2013-06-03: 5 mg via RESPIRATORY_TRACT

## 2013-06-03 MED ORDER — METHYLPREDNISOLONE SODIUM SUCC 125 MG IJ SOLR
INTRAMUSCULAR | Status: AC
  Filled 2013-06-03: qty 2

## 2013-06-03 MED ORDER — MOXIFLOXACIN HCL 400 MG PO TABS: 400.0000 mg | ORAL_TABLET | Freq: Every day | ORAL | Status: DC

## 2013-06-03 NOTE — ED Notes (Signed)
37 yr old is here today with complaints of cough-green; Nasal - thick white; wheezing, SOB, low grade temperature, chills, chest congestion x 4 dys. She states she has tried Mucinex, DayQuil, and Theraflu without improvement. Denies: Hx of Bronchitis or Pneumonia LMP: 05/23/2013

## 2013-06-03 NOTE — ED Provider Notes (Signed)
CSN: [id number]     Arrival date & time 06/03/13  1546 History   First MD Initiated Contact with Patient 06/03/13 1653     Chief Complaint  Patient presents with  . Croup  . Shortness of Breath  . Wheezing   (Consider location/radiation/quality/duration/timing/severity/associated sxs/prior Treatment) Patient is a 37 y.o. female presenting with shortness of breath. The history is provided by the patient.  Shortness of Breath Severity:  Moderate Onset quality:  Gradual Duration:  4 days Timing:  Constant Progression:  Worsening Chronicity:  New Context: URI   Associated symptoms: cough, sputum production and wheezing   Associated symptoms: no fever   Risk factors: tobacco use     Past Medical History  Diagnosis Date  . No pertinent past medical history   . Abscess of left leg   . PCOS (polycystic ovarian syndrome)   . Abnormal Pap smear 12/2012   Past Surgical History  Procedure Laterality Date  . Hysteroscopy w/d&c  05/13/2011    Procedure: DILATATION AND CURETTAGE (D&C) /HYSTEROSCOPY;  Surgeon: Lenoard Aden, MD;  Location: [facility];  Service: Gynecology;;  . Dilation and curettage of uterus  2009  . Uterine fibroid surgery  2012   No family history on file. History  Substance Use Topics  . Smoking status: Current Every Day Smoker -- 0.50 packs/day for 10 years    Types: Cigarettes  . Smokeless tobacco: Not on file  . Alcohol Use: Yes     Comment: occasionally   OB History   Grav Para Term Preterm Abortions TAB SAB Ect Mult Living   0 0 0 0 0 0 0 0 0 0      Review of Systems  Constitutional: Negative.  Negative for fever.  HENT: Positive for congestion and rhinorrhea.   Respiratory: Positive for cough, sputum production, shortness of breath and wheezing.   Gastrointestinal: Negative.   Genitourinary: Negative.     Allergies  Review of patient's allergies indicates no known allergies.  Home Medications   Current Outpatient Rx  Name  Route  Sig  Dispense   Refill  . Multiple Vitamins-Minerals (MULTIVITAMIN WITH MINERALS) tablet   Oral   Take 1 tablet by mouth daily.         . cephALEXin (KEFLEX) 500 MG capsule   Oral   Take 1 capsule (500 mg total) by mouth 4 (four) times daily.   20 capsule   0   . methylPREDNISolone (MEDROL DOSEPAK) 4 MG tablet      follow package directions, start on mon, take until finished   21 tablet   0   . moxifloxacin (AVELOX) 400 MG tablet   Oral   Take 1 tablet (400 mg total) by mouth daily. One tab daily   7 tablet   0    BP 128/81  Pulse 90  Temp(Src) 100.2 F (37.9 C) (Oral)  Resp 23  SpO2 95%  LMP 05/23/2013 Physical Exam  Nursing note and vitals reviewed. Constitutional: She is oriented to person, place, and time. She appears well-developed and well-nourished.  HENT:  Head: Normocephalic.  Right Ear: External ear normal.  Left Ear: External ear normal.  Mouth/Throat: Oropharynx is clear and moist.  Neck: Normal range of motion. Neck supple.  Pulmonary/Chest: She has wheezes.  Abdominal: Soft. Bowel sounds are normal.  Neurological: She is alert and oriented to person, place, and time.  Skin: Skin is warm and dry.    ED Course  Procedures (including critical care time) Labs Review  Labs Reviewed - No data to display Imaging Review Dg Chest 2 View  06/03/2013   CLINICAL DATA:  Cough, wheezing  EXAM: CHEST  2 VIEW  COMPARISON:  None.  FINDINGS: Cardiomediastinal silhouette is unremarkable. Central mild airways thickening. There is hazy airspace is left base retrocardiac highly suspicious for infiltrate. Follow-up to resolution is recommended.  IMPRESSION: Central mild airways thickening. There is hazy airspace is left base retrocardiac highly suspicious for infiltrate. Follow-up to resolution is recommended.   Electronically Signed   By: Natasha Mead M.D.   On: 06/03/2013 17:44    EKG Interpretation     Ventricular Rate:    PR Interval:    QRS Duration:   QT Interval:    QTC  Calculation:   R Axis:     Text Interpretation:              MDM  X-rays reviewed and report per radiologist.     Linna Hoff, MD 06/03/13 1819

## 2013-10-29 ENCOUNTER — Other Ambulatory Visit (HOSPITAL_COMMUNITY)
Admission: RE | Admit: 2013-10-29 | Discharge: 2013-10-29 | Disposition: A | Discharge status: Home / Self Care | Payer: 59 | Source: Ambulatory Visit | Attending: Emergency Medicine | Admitting: Emergency Medicine

## 2013-10-29 ENCOUNTER — Encounter (HOSPITAL_COMMUNITY): Payer: Self-pay | Admitting: Emergency Medicine

## 2013-10-29 ENCOUNTER — Emergency Department (HOSPITAL_COMMUNITY)
Admission: EM | Admit: 2013-10-29 | Discharge: 2013-10-29 | Disposition: A | Discharge status: Home / Self Care | Payer: 59 | Source: Home / Self Care

## 2013-10-29 DIAGNOSIS — B9689 Other specified bacterial agents as the cause of diseases classified elsewhere: Secondary | ICD-10-CM

## 2013-10-29 DIAGNOSIS — Z113 Encounter for screening for infections with a predominantly sexual mode of transmission: Secondary | ICD-10-CM | POA: Insufficient documentation

## 2013-10-29 DIAGNOSIS — A499 Bacterial infection, unspecified: Secondary | ICD-10-CM

## 2013-10-29 DIAGNOSIS — N760 Acute vaginitis: Secondary | ICD-10-CM

## 2013-10-29 LAB — POCT URINALYSIS DIP (DEVICE)
Bilirubin Urine: NEGATIVE
Glucose, UA: NEGATIVE mg/dL
HGB URINE DIPSTICK: NEGATIVE
Ketones, ur: NEGATIVE mg/dL
Leukocytes, UA: NEGATIVE
Nitrite: NEGATIVE
PROTEIN: NEGATIVE mg/dL
Specific Gravity, Urine: >=1.03 (ref 1.005–1.030)
UROBILINOGEN UA: 0.2 mg/dL (ref 0.0–1.0)
pH: 6 (ref 5.0–8.0)

## 2013-10-29 LAB — POCT PREGNANCY, URINE: Preg Test, Ur: NEGATIVE

## 2013-10-29 MED ORDER — METRONIDAZOLE 0.75 % VA GEL: 1.0000 | Freq: Two times a day (BID) | VAGINAL | Status: DC

## 2013-10-29 NOTE — ED Provider Notes (Signed)
Chief Complaint   Chief Complaint  Patient presents with  . Vaginitis    History of Present Illness   Sally Price is a 38 year old female who has had a three-day history of malodorous vaginal discharge. This is white in color and there is no itching. She had a slight pelvic aching pain. She denies any lower back pain, fever, chills, nausea, vomiting, or urinary symptoms. Symptoms came on after she removed a tampon. Her last menstrual period was March 28. She is sexually active with use of condoms. She has a history of bacterial vaginosis.  Review of Systems   Other than as noted above, the patient denies any of the following symptoms: Systemic:  No fever or chills GI:  No abdominal pain, nausea, vomiting, diarrhea, constipation, melena or hematochezia. GU:  No dysuria, frequency, urgency, hematuria, vaginal discharge, itching, or abnormal vaginal bleeding.  [place]   Past medical history, family history, social history, meds, and allergies were reviewed.    Physical Examination    Vital signs:  BP 141/89  Pulse 80  Temp(Src) 98 F (36.7 C) (Oral)  Resp 16  SpO2 98%  LMP 10/25/2013 General:  Alert, oriented and in no distress. Lungs:  Breath sounds clear and equal bilaterally.  No wheezes, rales or rhonchi. Heart:  Regular rhythm.  No gallops or murmers. Abdomen:  Soft, flat and non-distended.  No organomegaly or mass.  No tenderness, guarding or rebound.  Bowel sounds normally active. Pelvic exam:  Normal external genitalia. There is a copious, white, nonodorous discharge. No foreign body was seen. There was no pain on cervical motion. Uterus was slightly irregular and enlarged in size but nontender. No adnexal tenderness or mass.  DNA probes for gonorrhea, Chlamydia, Trichomonas, Gardnerella, Candida were obtained. Skin:  Clear, warm and dry.  Labs   Results for orders placed during the hospital encounter of 10/29/13  POCT URINALYSIS DIP (DEVICE)      Result Value Ref  Range   Glucose, UA NEGATIVE  NEGATIVE mg/dL   Bilirubin Urine NEGATIVE  NEGATIVE   Ketones, ur NEGATIVE  NEGATIVE mg/dL   Specific Gravity, Urine >=1.030  1.005 - 1.030   Hgb urine dipstick NEGATIVE  NEGATIVE   pH 6.0  5.0 - 8.0   Protein, ur NEGATIVE  NEGATIVE mg/dL   Urobilinogen, UA 0.2  0.0 - 1.0 mg/dL   Nitrite NEGATIVE  NEGATIVE   Leukocytes, UA NEGATIVE  NEGATIVE  POCT PREGNANCY, URINE      Result Value Ref Range   Preg Test, Ur NEGATIVE  NEGATIVE    Assessment   The encounter diagnosis was Bacterial vaginosis.       Plan    1.  Meds:  The following meds were prescribed:   Discharge Medication List as of 10/29/2013  1:41 PM    START taking these medications   Details  metroNIDAZOLE (METROGEL) 0.75 % vaginal gel Place 1 Applicatorful vaginally 2 (two) times daily., Starting 10/29/2013, Until Discontinued, Normal        2.  Patient Education/Counseling:  The patient was given appropriate handouts, self care instructions, and instructed in symptomatic relief.    3.  Follow up:  The patient was told to follow up here if no better in 3 to 4 days, or sooner if becoming worse in any way, and given some red flag symptoms such as worsening pain, fever, persistent vomiting, or heavy vaginal bleeding which would prompt immediate return.      Harden Mo, MD 10/29/13 [phone]

## 2013-10-29 NOTE — ED Notes (Signed)
Call back number for lab issues verified 

## 2013-10-29 NOTE — Discharge Instructions (Signed)
To restore the normal balance of "good bacteria" in your system.  Take a probiotic once daily.  These can be gotten over the counter at the drug store without a prescription and come under various brand names such as [place], [place], [employer], and [employer].  The best thing to do is to ask your pharmacist to recommend a good probiotic that is not too expensive.     Bacterial Vaginosis Bacterial vaginosis is a vaginal infection that occurs when the normal balance of bacteria in the vagina is disrupted. It results from an overgrowth of certain bacteria. This is the most common vaginal infection in women of childbearing age. Treatment is important to prevent complications, especially in pregnant women, as it can cause a premature delivery. CAUSES  Bacterial vaginosis is caused by an increase in harmful bacteria that are normally present in smaller amounts in the vagina. Several different kinds of bacteria can cause bacterial vaginosis. However, the reason that the condition develops is not fully understood. RISK FACTORS Certain activities or behaviors can put you at an increased risk of developing bacterial vaginosis, including:  Having a new sex partner or multiple sex partners.  Douching.  Using an intrauterine device (IUD) for contraception. Women do not get bacterial vaginosis from toilet seats, bedding, swimming pools, or contact with objects around them. SIGNS AND SYMPTOMS  Some women with bacterial vaginosis have no signs or symptoms. Common symptoms include:  Grey vaginal discharge.  A fishlike odor with discharge, especially after sexual intercourse.  Itching or burning of the vagina and vulva.  Burning or pain with urination. DIAGNOSIS  Your health care provider will take a medical history and examine the vagina for signs of bacterial vaginosis. A sample of vaginal fluid may be taken. Your health care provider will look at this sample under a microscope to check for bacteria and  abnormal cells. A vaginal pH test may also be done.  TREATMENT  Bacterial vaginosis may be treated with antibiotic medicines. These may be given in the form of a pill or a vaginal cream. A second round of antibiotics may be prescribed if the condition comes back after treatment.  HOME CARE INSTRUCTIONS   Only take over-the-counter or prescription medicines as directed by your health care provider.  If antibiotic medicine was prescribed, take it as directed. Make sure you finish it even if you start to feel better.  Do not have sex until treatment is completed.  Tell all sexual partners that you have a vaginal infection. They should see their health care provider and be treated if they have problems, such as a mild rash or itching.  Practice safe sex by using condoms and only having one sex partner. SEEK MEDICAL CARE IF:   Your symptoms are not improving after 3 days of treatment.  You have increased discharge or pain.  You have a fever. MAKE SURE YOU:   Understand these instructions.  Will watch your condition.  Will get help right away if you are not doing well or get worse. FOR MORE INFORMATION  Centers for Disease Control and Prevention, Division of STD Prevention: [employer] American Sexual Health Association (ASHA): www.ashastd.org  Document Released: 07/12/2005 Document Revised: 05/02/2013 Document Reviewed: 02/21/2013 [facility] Patient Information 2014 [place].

## 2013-10-29 NOTE — ED Notes (Signed)
C/o 2-3 day duration of lower abdominal pain and vaginal odor

## 2013-10-30 LAB — CERVICOVAGINAL ANCILLARY ONLY
Chlamydia: NEGATIVE
Neisseria Gonorrhea: NEGATIVE
WET PREP (BD AFFIRM): NEGATIVE
WET PREP (BD AFFIRM): POSITIVE — AB
Wet Prep (BD Affirm): NEGATIVE

## 2013-10-30 NOTE — Progress Notes (Signed)
Quick Note:  Results are abnormal as noted, but have been adequately treated. No further action necessary. ______ 

## 2013-10-31 NOTE — ED Notes (Signed)
GC/Chlamydia neg., Affirm: Candida and Trich neg., Gardnerella pos.  Pt. adequately treated with Metrogel. Hanley Seamen [facility] 10/31/2013

## 2015-01-29 ENCOUNTER — Other Ambulatory Visit: Payer: Self-pay

## 2015-01-31 LAB — CYTOLOGY - PAP

## 2015-03-12 ENCOUNTER — Ambulatory Visit: Payer: Self-pay

## 2015-03-12 ENCOUNTER — Ambulatory Visit: Payer: Self-pay | Admitting: Oncology

## 2015-03-12 ENCOUNTER — Other Ambulatory Visit: Payer: Self-pay

## 2016-03-19 ENCOUNTER — Encounter (HOSPITAL_COMMUNITY): Payer: Self-pay | Admitting: *Deleted

## 2016-03-19 ENCOUNTER — Ambulatory Visit (HOSPITAL_COMMUNITY)
Admission: EM | Admit: 2016-03-19 | Discharge: 2016-03-19 | Disposition: A | Discharge status: Home / Self Care | Payer: Self-pay

## 2016-03-19 DIAGNOSIS — N758 Other diseases of Bartholin's gland: Secondary | ICD-10-CM

## 2016-03-19 NOTE — ED Provider Notes (Signed)
[place]    CSN: [id number] Arrival date & time: 03/19/16  [id number]  First Provider Contact:  First MD Initiated Contact with Patient 03/19/16 1037        History   Chief Complaint Chief Complaint  Patient presents with  . Abscess    HPI Sally Price is a 40 y.o. female.   The history is provided by the patient.  Abscess  Location:  Pelvis Pelvic abscess location:  Vulva Abscess quality: induration and warmth   Red streaking: no   Duration:  2 days Chronicity:  New Context comment:  Swollen labial skin with spont drainage after warm compress for 2 days, here for check after consult with [employer] Relieved by:  Warm water soaks Associated symptoms: no fever   Risk factors: no prior abscess     Past Medical History:  Diagnosis Date  . Abnormal Pap smear 12/2012  . Abscess of left leg   . No pertinent past medical history   . PCOS (polycystic ovarian syndrome)     There are no active problems to display for this patient.   Past Surgical History:  Procedure Laterality Date  . DILATION AND CURETTAGE OF UTERUS  2009  . HYSTEROSCOPY W/D&C  05/13/2011   Procedure: DILATATION AND CURETTAGE (D&C) /HYSTEROSCOPY;  Surgeon: Lovenia Kim, MD;  Location: [place] [facility];  Service: Gynecology;;  . UTERINE FIBROID SURGERY  2012    OB History    Gravida Para Term Preterm AB Living   0 0 0 0 0 0   SAB TAB Ectopic Multiple Live Births   0 0 0 0         Home Medications    Prior to Admission medications   Medication Sig Start Date End Date Taking? Authorizing Provider  cephALEXin (KEFLEX) 500 MG capsule Take 1 capsule (500 mg total) by mouth 4 (four) times daily. 05/22/13   Seabron Spates, CNM  methylPREDNISolone (MEDROL DOSEPAK) 4 MG tablet follow package directions, start on mon, take until finished 06/03/13   Billy Fischer, MD  metroNIDAZOLE (METROGEL) 0.75 % vaginal gel Place 1 Applicatorful vaginally 2 (two) times daily. 10/29/13   Harden Mo, MD    moxifloxacin (AVELOX) 400 MG tablet Take 1 tablet (400 mg total) by mouth daily. One tab daily 06/03/13   Billy Fischer, MD  Multiple Vitamins-Minerals (MULTIVITAMIN WITH MINERALS) tablet Take 1 tablet by mouth daily.    Historical Provider, MD    Family History History reviewed. No pertinent family history.  Social History Social History  Substance Use Topics  . Smoking status: Current Every Day Smoker    Packs/day: 0.50    Years: 10.00    Types: Cigarettes  . Smokeless tobacco: Not on file  . Alcohol use Yes     Comment: occasionally     Allergies   Review of patient's allergies indicates no known allergies.   Review of Systems Review of Systems  Constitutional: Negative.  Negative for fever.  Skin: Positive for wound.  All other systems reviewed and are negative.    Physical Exam Triage Vital Signs ED Triage Vitals [03/19/16 1024]  Enc Vitals Group     BP 114/71     Pulse Rate 97     Resp 16     Temp 98.6 F (37 C)     Temp Source Oral     SpO2 100 %     Weight      Height  Head Circumference      Peak Flow      Pain Score      Pain Loc      Pain Edu?      Excl. in [place]?    No data found.   Updated Vital Signs BP 114/71 (BP Location: Left Arm)   Pulse 97   Temp 98.6 F (37 C) (Oral)   Resp 16   SpO2 100%   Visual Acuity Right Eye Distance:   Left Eye Distance:   Bilateral Distance:    Right Eye Near:   Left Eye Near:    Bilateral Near:     Physical Exam  Constitutional: She is oriented to person, place, and time. She appears well-developed and well-nourished.  Genitourinary:  Genitourinary Comments: Sl left labial soreness and sts, no lesions or drainage  Neurological: She is alert and oriented to person, place, and time.  Nursing note and vitals reviewed.    UC Treatments / Results  Labs (all labs ordered are listed, but only abnormal results are displayed) Labs Reviewed - No data to display  EKG  EKG Interpretation None        Radiology No results found.  Procedures Procedures (including critical care time)  Medications Ordered in UC Medications - No data to display   Initial Impression / Assessment and Plan / UC Course  I have reviewed the triage vital signs and the nursing notes.  Pertinent labs & imaging results that were available during my care of the patient were reviewed by me and considered in my medical decision making (see chart for details).  Clinical Course      Final Clinical Impressions(s) / UC Diagnoses   Final diagnoses:  None    New Prescriptions New Prescriptions   No medications on file     Billy Fischer, MD 03/19/16 1053

## 2016-03-19 NOTE — ED Triage Notes (Signed)
Pt      Reports  Boil  On vagina     Area   X   sev  Days         She reports    It is  painfull  To the  Touch       She  Has  Been  Applying  Warm  Compresses   To  The area

## 2016-03-19 NOTE — Discharge Instructions (Signed)
Continue warm soak twice a day over weekend, if further problems [facility]

## 2016-05-05 ENCOUNTER — Encounter (HOSPITAL_COMMUNITY): Payer: Self-pay

## 2016-05-05 ENCOUNTER — Inpatient Hospital Stay (HOSPITAL_COMMUNITY)
Admission: AD | Admit: 2016-05-05 | Discharge: 2016-05-05 | Disposition: A | Discharge status: Home / Self Care | Payer: 59 | Source: Ambulatory Visit | Attending: Obstetrics and Gynecology | Admitting: Obstetrics and Gynecology

## 2016-05-05 DIAGNOSIS — N898 Other specified noninflammatory disorders of vagina: Secondary | ICD-10-CM | POA: Diagnosis not present

## 2016-05-05 DIAGNOSIS — R109 Unspecified abdominal pain: Secondary | ICD-10-CM | POA: Diagnosis not present

## 2016-05-05 DIAGNOSIS — F17210 Nicotine dependence, cigarettes, uncomplicated: Secondary | ICD-10-CM | POA: Insufficient documentation

## 2016-05-05 LAB — URINALYSIS, ROUTINE W REFLEX MICROSCOPIC
Bilirubin Urine: NEGATIVE
Glucose, UA: NEGATIVE mg/dL
HGB URINE DIPSTICK: NEGATIVE
Ketones, ur: NEGATIVE mg/dL
Leukocytes, UA: NEGATIVE
Nitrite: NEGATIVE
PH: 6 (ref 5.0–8.0)
Protein, ur: NEGATIVE mg/dL
SPECIFIC GRAVITY, URINE: 1.01 (ref 1.005–1.030)

## 2016-05-05 LAB — WET PREP, GENITAL
Clue Cells Wet Prep HPF POC: NONE SEEN
SPERM: NONE SEEN
Trich, Wet Prep: NONE SEEN
Yeast Wet Prep HPF POC: NONE SEEN

## 2016-05-05 LAB — POCT PREGNANCY, URINE: Preg Test, Ur: NEGATIVE

## 2016-05-05 NOTE — MAU Note (Signed)
Having low pains and sharp shooting pains in her vagina.  Smells like a rotten egg. Started on Friday.

## 2016-05-05 NOTE — MAU Provider Note (Signed)
History     CSN: [id number]  Arrival date and time: 05/05/16 1810   None     Chief Complaint  Patient presents with  . Abdominal Pain  . Vaginal Pain  . vag odor   Non-pregnant female here with c/o clear, malodorous vaginal discharge x5 days. She denies itching and irritation. SHe also reports mild uterine cramps since onset of discharge for which she took Tylenol and had relief. She denies any new skin products or detergents. No new partner for 2 years. No hx of STIs. She has not used any OTC remedies for the discharge.    Pertinent Gynecological History: Menses: 04/21/16 Contraception: none Sexually transmitted diseases: no past history  Past Medical History:  Diagnosis Date  . Abnormal Pap smear 12/2012  . Abscess of left leg   . No pertinent past medical history   . PCOS (polycystic ovarian syndrome)     Past Surgical History:  Procedure Laterality Date  . DILATION AND CURETTAGE OF UTERUS  2009  . HYSTEROSCOPY W/D&C  05/13/2011   Procedure: DILATATION AND CURETTAGE (D&C) /HYSTEROSCOPY;  Surgeon: Lovenia Kim, MD;  Location: [place] [facility];  Service: Gynecology;;  . UTERINE FIBROID SURGERY  2012    History reviewed. No pertinent family history.  Social History  Substance Use Topics  . Smoking status: Current Every Day Smoker    Packs/day: 0.50    Years: 10.00    Types: Cigarettes  . Smokeless tobacco: Not on file  . Alcohol use Yes     Comment: occasionally    Allergies: No Known Allergies  Prescriptions Prior to Admission  Medication Sig Dispense Refill Last Dose  . Prenatal Vit-Fe Fumarate-FA (PRENATAL MULTIVITAMIN) TABS tablet Take 1 tablet by mouth daily at 12 noon.   05/05/2016 at Unknown time  . cephALEXin (KEFLEX) 500 MG capsule Take 1 capsule (500 mg total) by mouth 4 (four) times daily. (Patient not taking: Reported on 05/05/2016) 20 capsule 0 Not Taking at Unknown time  . methylPREDNISolone (MEDROL DOSEPAK) 4 MG tablet follow package directions,  start on mon, take until finished (Patient not taking: Reported on 05/05/2016) 21 tablet 0 Not Taking at Unknown time  . metroNIDAZOLE (METROGEL) 0.75 % vaginal gel Place 1 Applicatorful vaginally 2 (two) times daily. (Patient not taking: Reported on 05/05/2016) 70 g 12 Not Taking at Unknown time  . moxifloxacin (AVELOX) 400 MG tablet Take 1 tablet (400 mg total) by mouth daily. One tab daily (Patient not taking: Reported on 05/05/2016) 7 tablet 0 Not Taking at Unknown time    Review of Systems  Constitutional: Negative.   Gastrointestinal: Positive for abdominal pain.  Genitourinary: Negative.    Physical Exam   Blood pressure 140/78, pulse 95, temperature 98.3 F (36.8 C), temperature source Oral, resp. rate 18, weight 87.9 kg (193 lb 12 oz), last menstrual period 04/21/2016.  Physical Exam  Constitutional: She is oriented to person, place, and time. She appears well-developed and well-nourished.  HENT:  Head: Normocephalic and atraumatic.  Neck: Normal range of motion.  Cardiovascular: Normal rate.   Respiratory: Effort normal.  GI: Soft. She exhibits no distension and no mass. There is no tenderness. There is no rebound and no guarding.  Genitourinary:  Genitourinary Comments: External: no lesions Vagina: rugated, nulli, thin yellow discharge Uterus: non enlarged, anteverted, mildly tender, no CMT Adnexae: no masses, mild tenderness left, mild tenderness right   Musculoskeletal: Normal range of motion.  Neurological: She is alert and oriented to person, place, and time.  Skin: Skin is warm and dry.  Psychiatric: She has a normal mood and affect.   Results for orders placed or performed during the hospital encounter of 05/05/16 (from the past 24 hour(s))  Urinalysis, Routine w reflex microscopic (not at [facility])     Status: None   Collection Time: 05/05/16  6:20 PM  Result Value Ref Range   Color, Urine YELLOW YELLOW   APPearance CLEAR CLEAR   Specific Gravity, Urine 1.010  1.005 - 1.030   pH 6.0 5.0 - 8.0   Glucose, UA NEGATIVE NEGATIVE mg/dL   Hgb urine dipstick NEGATIVE NEGATIVE   Bilirubin Urine NEGATIVE NEGATIVE   Ketones, ur NEGATIVE NEGATIVE mg/dL   Protein, ur NEGATIVE NEGATIVE mg/dL   Nitrite NEGATIVE NEGATIVE   Leukocytes, UA NEGATIVE NEGATIVE  Pregnancy, urine POC     Status: None   Collection Time: 05/05/16  6:32 PM  Result Value Ref Range   Preg Test, Ur NEGATIVE NEGATIVE  Wet prep, genital     Status: Abnormal   Collection Time: 05/05/16  7:10 PM  Result Value Ref Range   Yeast Wet Prep HPF POC NONE SEEN NONE SEEN   Trich, Wet Prep NONE SEEN NONE SEEN   Clue Cells Wet Prep HPF POC NONE SEEN NONE SEEN   WBC, Wet Prep HPF POC FEW (A) NONE SEEN   Sperm NONE SEEN     MAU Course  Procedures  MDM Labs ordered and reviewed. No evidence of acute pelvic process or infection. Stable for discharge home.   Assessment and Plan   1. Vaginal discharge    Discharge home Follow up with Gyn provider as needed    Medication List    STOP taking these medications   cephALEXin 500 MG capsule Commonly known as:  KEFLEX   methylPREDNISolone 4 MG tablet Commonly known as:  MEDROL DOSEPAK   metroNIDAZOLE 0.75 % vaginal gel Commonly known as:  METROGEL   moxifloxacin 400 MG tablet Commonly known as:  AVELOX     TAKE these medications   prenatal multivitamin Tabs tablet Take 1 tablet by mouth daily at 12 noon.      Julianne Handler, CNM 05/05/2016, 7:25 PM

## 2016-05-06 LAB — GC/CHLAMYDIA PROBE AMP (~~LOC~~) NOT AT ARMC
CHLAMYDIA, DNA PROBE: NEGATIVE
Neisseria Gonorrhea: NEGATIVE

## 2019-05-17 ENCOUNTER — Emergency Department (HOSPITAL_BASED_OUTPATIENT_CLINIC_OR_DEPARTMENT_OTHER): Admission: EM | Admit: 2019-05-17 | Discharge: 2019-05-17 | Discharge status: Absent without leave | Payer: 59

## 2021-06-04 ENCOUNTER — Emergency Department (HOSPITAL_COMMUNITY)
Admission: EM | Admit: 2021-06-04 | Discharge: 2021-06-04 | Discharge status: Against Medical Advice | Payer: No Typology Code available for payment source | Attending: Emergency Medicine | Admitting: Emergency Medicine

## 2021-06-04 ENCOUNTER — Encounter (HOSPITAL_COMMUNITY): Payer: Self-pay | Admitting: Oncology

## 2021-06-04 DIAGNOSIS — D649 Anemia, unspecified: Secondary | ICD-10-CM | POA: Insufficient documentation

## 2021-06-04 DIAGNOSIS — Z5329 Procedure and treatment not carried out because of patient's decision for other reasons: Secondary | ICD-10-CM

## 2021-06-04 DIAGNOSIS — F17210 Nicotine dependence, cigarettes, uncomplicated: Secondary | ICD-10-CM | POA: Diagnosis not present

## 2021-06-04 DIAGNOSIS — R1032 Left lower quadrant pain: Secondary | ICD-10-CM | POA: Diagnosis not present

## 2021-06-04 DIAGNOSIS — Z9114 Patient's other noncompliance with medication regimen: Secondary | ICD-10-CM | POA: Insufficient documentation

## 2021-06-04 LAB — CBC
HCT: 22.9 % — ABNORMAL LOW (ref 36.0–46.0)
Hemoglobin: 5.5 g/dL — CL (ref 12.0–15.0)
MCH: 15 pg — ABNORMAL LOW (ref 26.0–34.0)
MCHC: 24 g/dL — ABNORMAL LOW (ref 30.0–36.0)
MCV: 62.6 fL — ABNORMAL LOW (ref 80.0–100.0)
Platelets: 736 10*3/uL — ABNORMAL HIGH (ref 150–400)
RBC: 3.66 MIL/uL — ABNORMAL LOW (ref 3.87–5.11)
RDW: 23.4 % — ABNORMAL HIGH (ref 11.5–15.5)
WBC: 12.6 10*3/uL — ABNORMAL HIGH (ref 4.0–10.5)
nRBC: 0.2 % (ref 0.0–0.2)

## 2021-06-04 LAB — COMPREHENSIVE METABOLIC PANEL
ALT: 17 U/L (ref 0–44)
AST: 17 U/L (ref 15–41)
Albumin: 4.2 g/dL (ref 3.5–5.0)
Alkaline Phosphatase: 53 U/L (ref 38–126)
Anion gap: 7 (ref 5–15)
BUN: 8 mg/dL (ref 6–20)
CO2: 24 mmol/L (ref 22–32)
Calcium: 9 mg/dL (ref 8.9–10.3)
Chloride: 109 mmol/L (ref 98–111)
Creatinine, Ser: 0.68 mg/dL (ref 0.44–1.00)
GFR, Estimated: >60 mL/min (ref >60)
Glucose, Bld: 124 mg/dL — ABNORMAL HIGH (ref 70–99)
Potassium: 3.4 mmol/L — ABNORMAL LOW (ref 3.5–5.1)
Sodium: 140 mmol/L (ref 135–145)
Total Bilirubin: 0.4 mg/dL (ref 0.3–1.2)
Total Protein: 7.3 g/dL (ref 6.5–8.1)

## 2021-06-04 LAB — I-STAT BETA HCG BLOOD, ED (MC, WL, AP ONLY): I-stat hCG, quantitative: <5 m[IU]/mL (ref <5)

## 2021-06-04 LAB — LIPASE, BLOOD: Lipase: 29 U/L (ref 11–51)

## 2021-06-04 MED ORDER — SODIUM CHLORIDE 0.9 % IV SOLN: 10.0000 mL/h | Freq: Once | INTRAVENOUS | Status: DC

## 2021-06-04 MED ORDER — PANTOPRAZOLE SODIUM 40 MG IV SOLR
40.0000 mg | Freq: Once | INTRAVENOUS | Status: DC
  Filled 2021-06-04: qty 40

## 2021-06-04 NOTE — ED Provider Notes (Cosign Needed)
Emergency Medicine Provider Triage Evaluation Note  [facility] , a 45 y.o. female  was evaluated in triage.  Pt complains of abd pain, nv  Review of Systems  Positive: Abd pain, nv Negative: fever  Physical Exam  BP (!) 124/51 (BP Location: Left Arm)   Pulse 85   Temp 98.2 F (36.8 C) (Oral)   Resp 20   Ht 5\' 3"  (1.6 m)   Wt 89.4 kg   LMP 06/01/2021 (Exact Date)   SpO2 100%   BMI 34.90 kg/m  Gen:   Awake, no distress   Resp:  Normal effort  MSK:   Moves extremities without difficulty   Medical Decision Making  Medically screening exam initiated at 6:52 PM.  Appropriate orders placed.  Madysun Dalton was informed that the remainder of the evaluation will be completed by another provider, this initial triage assessment does not replace that evaluation, and the importance of remaining in the ED until their evaluation is complete.     Rodney Booze, [place] 06/04/21 2589

## 2021-06-04 NOTE — ED Notes (Signed)
Patient signed [facility] electronically. Dr Pearline Cables aware of the situation. RN explained the benefits of staying to figure out what is causing her abdominal pain. Patient still wanting to leave against medical advice.

## 2021-06-04 NOTE — ED Triage Notes (Signed)
Pt bib [facility] d/t abdominal pain. EMS states she woke from a nap in pain.  Took motrin however vomited took an additional motrin.  Pt currently on her cycle. Pt reproted to EMS feeling a, "Popping" sensation then felt as if abdomen was in a knot.

## 2021-06-04 NOTE — ED Provider Notes (Signed)
[facility] DEPT Provider Note   CSN: [id number] Arrival date & time: 06/04/21  1828     History Chief Complaint  Patient presents with   Abdominal Pain    Sally Price is a 45 y.o. female.  This is a 45 y.o. female with significant medical history as below, including PCOS, uterine fibroids who presents to the ED with complaint of abdominal pain  Location: Left lower quadrant Duration: 12 hours Onset: Sudden Timing: Constant Description: Sharp, stabbing, aching Severity: Mild Exacerbating/Alleviating Factors: Worse with palpation, torso twisting Associated Symptoms: Generalized malaise Pertinent Negatives: No blood per rectum or black-colored stool, no excessive vaginal bleeding, no hematemesis Context: pt with fibroids, heavy menstrual periods, on chronic iron supplements. Has not been taking iron supplements, patient currently on her cycle but does not feel as though her menstrual cycle has been at its typical heaviness.  She woke up from a nap earlier and felt pain to her left lower quadrant, suprapubic area.  No reported pelvic pain, no abnormal vaginal bleeding or discharge.  No blood per rectum or black stool.  Eating/Drinking normally.  No change to bowel/bladder habits   The history is provided by the patient. No language interpreter was used.  Abdominal Pain Associated symptoms: no chest pain, no cough, no dysuria, no fever, no nausea and no shortness of breath       Past Medical History:  Diagnosis Date   Abnormal Pap smear 12/2012   Abscess of left leg    No pertinent past medical history    PCOS (polycystic ovarian syndrome)     There are no problems to display for this patient.   Past Surgical History:  Procedure Laterality Date   DILATION AND CURETTAGE OF UTERUS  2009   HYSTEROSCOPY WITH D & C  05/13/2011   Procedure: DILATATION AND CURETTAGE (D&C) /HYSTEROSCOPY;  Surgeon: Lovenia Kim, MD;  Location: [facility];   Service: Gynecology;;   UTERINE FIBROID SURGERY  2012     OB History     Gravida  0   Para  0   Term  0   Preterm  0   AB  0   Living  0      SAB  0   IAB  0   Ectopic  0   Multiple  0   Live Births              No family history on file.  Social History   Tobacco Use   Smoking status: Every Day    Packs/day: 0.50    Years: 10.00    Pack years: 5.00    Types: Cigarettes   Smokeless tobacco: Never  Vaping Use   Vaping Use: Never used  Substance Use Topics   Alcohol use: Yes    Comment: occasionally   Drug use: No    Home Medications Prior to Admission medications   Medication Sig Start Date End Date Taking? Authorizing Provider  Prenatal Vit-Fe Fumarate-FA (PRENATAL MULTIVITAMIN) TABS tablet Take 1 tablet by mouth daily at 12 noon.    [provider]    Allergies    Patient has no known allergies.  Review of Systems   Review of Systems  Constitutional:  Negative for activity change and fever.  HENT:  Negative for facial swelling and trouble swallowing.   Eyes:  Negative for discharge and redness.  Respiratory:  Negative for cough and shortness of breath.   Cardiovascular:  Negative for chest pain  and palpitations.  Gastrointestinal:  Positive for abdominal pain. Negative for nausea.  Genitourinary:  Negative for dysuria and flank pain.  Musculoskeletal:  Negative for back pain and gait problem.  Skin:  Negative for pallor and rash.  Neurological:  Negative for syncope and headaches.   Physical Exam Updated Vital Signs BP (!) 116/55 (BP Location: Left Arm)   Pulse 80   Temp 98.2 F (36.8 C) (Oral)   Resp 18   Ht 5\' 3"  (1.6 m)   Wt 89.4 kg   LMP 06/01/2021 (Exact Date)   SpO2 100%   BMI 34.90 kg/m   Physical Exam Vitals and nursing note reviewed.  Constitutional:      General: She is not in acute distress.    Appearance: Normal appearance.  HENT:     Head: Normocephalic and atraumatic.     Comments: Pale  conjunctiva     Right Ear: External ear normal.     Left Ear: External ear normal.     Nose: Nose normal.     Mouth/Throat:     Mouth: Mucous membranes are moist.  Eyes:     General: No scleral icterus.       Right eye: No discharge.        Left eye: No discharge.  Cardiovascular:     Rate and Rhythm: Normal rate and regular rhythm.     Pulses: Normal pulses.     Heart sounds: Normal heart sounds.  Pulmonary:     Effort: Pulmonary effort is normal. No respiratory distress.     Breath sounds: Normal breath sounds.  Abdominal:     General: Abdomen is flat.     Tenderness: There is abdominal tenderness in the left lower quadrant.  Musculoskeletal:        General: Normal range of motion.     Cervical back: Normal range of motion.     Right lower leg: No edema.     Left lower leg: No edema.  Skin:    General: Skin is warm and dry.     Capillary Refill: Capillary refill takes less than 2 seconds.  Neurological:     Mental Status: She is alert and oriented to person, place, and time.     GCS: GCS eye subscore is 4. GCS verbal subscore is 5. GCS motor subscore is 6.  Psychiatric:        Mood and Affect: Mood normal.        Behavior: Behavior normal.    ED Results / Procedures / Treatments   Labs (all labs ordered are listed, but only abnormal results are displayed) Labs Reviewed  COMPREHENSIVE METABOLIC PANEL - Abnormal; Notable for the following components:      Result Value   Potassium 3.4 (*)    Glucose, Bld 124 (*)    All other components within normal limits  CBC - Abnormal; Notable for the following components:   WBC 12.6 (*)    RBC 3.66 (*)    Hemoglobin 5.5 (*)    HCT 22.9 (*)    MCV 62.6 (*)    MCH 15.0 (*)    MCHC 24.0 (*)    RDW 23.4 (*)    Platelets 736 (*)    All other components within normal limits  LIPASE, BLOOD  I-STAT BETA HCG BLOOD, ED ([facility], [facility], [facility] ONLY)  TYPE AND SCREEN  PREPARE RBC (CROSSMATCH)    EKG None  Radiology No results  found.  Procedures Procedures   Medications Ordered  in ED Medications - No data to display  ED Course  I have reviewed the triage vital signs and the nursing notes.  Pertinent labs & imaging results that were available during my care of the patient were reviewed by me and considered in my medical decision making (see chart for details).    MDM Rules/Calculators/A&P                           CC: abd pain  This patient complains of above; this involves an extensive number of treatment options and is a complaint that carries with it a high risk of complications and morbidity. Vital signs were reviewed. Serious etiologies considered.  Record review:  Previous records obtained and reviewed   Work up as above, notable for:  Labs results that were available during my care of the patient were reviewed by me and considered in my medical decision making.   Patient found to have hemoglobin 5.5.  She has some left lower quadrant abdominal pain on palpation.  Abdomen is not peritoneal.  VSS. Mentation appears appropriate. Patient denies active vaginal bleeding or rectal bleeding. No hematemesis.  Discussed my recommendation for blood transfusion which she adamantly refused.  Patient also refusing IV or CT imaging. Refusing any further workup or intervention at this time.  A&O x3, does not appear to be intoxicated or psychotic.  The patient has requested to leave the ED against medical advice. I believe this patient is of sound mind and medical decision making capacity to refuse medical care. The patient is responding and asking questions appropriately. The patient is oriented to person, place and time. The patient is not psychotic, delusional, suicidal, homicidal or hallucinating. The patient demonstrates a normal mental capacity to make decisions regarding their healthcare. The patient is clinically sober and does not appear to be under the influence of any illicit drugs at this time. The patient  has been advised of the risks, in layman terms, of leaving AMA which include, but are not limited to death, coma, permanent disability, loss of current lifestyle, delay in diagnosis. Alternatives have been offered - the patient remains steadfast in their wish to leave. The patient has been advised that should they change their mind they are welcome to return to this hospital, or any other, at any time. The patient understands that in no way does an [facility] discharge mean that I do not want them to have the best medical care available. To this end, I have offered appropriate prescriptions, referrals, and discharge instructions. The patient did sign AMA paperwork. The above discussion was witnessed by another member of staff. She left prior to receiving AVS paperwork.        This chart was dictated using voice recognition software.  Despite best efforts to proofread,  errors can occur which can change the documentation meaning.  Final Clinical Impression(s) / ED Diagnoses Final diagnoses:  Anemia, unspecified type  Noncompliance with medication regimen  Left against medical advice    Rx / DC Orders ED Discharge Orders     None        Jeanell Sparrow, DO 06/05/21 [id number]

## 2021-06-04 NOTE — ED Notes (Signed)
Patient stated she did not want the blood transfusion or IV. She stated she is refusing all of this because she has had blood transfusions in the past, and it only temporarily fixes the problem. She said to fix the problem permanently her OB told her to get a hysterectomy. Dr. Pearline Cables is at bedside and explained the risk of leaving and how it can be fatal as her body is not getting enough oxygen to its cells. She still stated she wants to leave.

## 2021-12-06 ENCOUNTER — Emergency Department (HOSPITAL_COMMUNITY): Payer: No Typology Code available for payment source

## 2021-12-06 ENCOUNTER — Emergency Department (HOSPITAL_COMMUNITY)
Admission: EM | Admit: 2021-12-06 | Discharge: 2021-12-06 | Disposition: A | Discharge status: Home / Self Care | Payer: No Typology Code available for payment source | Attending: Emergency Medicine | Admitting: Emergency Medicine

## 2021-12-06 ENCOUNTER — Encounter (HOSPITAL_COMMUNITY): Payer: Self-pay

## 2021-12-06 ENCOUNTER — Other Ambulatory Visit: Payer: Self-pay

## 2021-12-06 DIAGNOSIS — S29012A Strain of muscle and tendon of back wall of thorax, initial encounter: Secondary | ICD-10-CM | POA: Diagnosis not present

## 2021-12-06 DIAGNOSIS — Z20822 Contact with and (suspected) exposure to covid-19: Secondary | ICD-10-CM | POA: Diagnosis not present

## 2021-12-06 DIAGNOSIS — D500 Iron deficiency anemia secondary to blood loss (chronic): Secondary | ICD-10-CM | POA: Diagnosis not present

## 2021-12-06 DIAGNOSIS — X58XXXA Exposure to other specified factors, initial encounter: Secondary | ICD-10-CM | POA: Insufficient documentation

## 2021-12-06 DIAGNOSIS — F17210 Nicotine dependence, cigarettes, uncomplicated: Secondary | ICD-10-CM | POA: Diagnosis not present

## 2021-12-06 DIAGNOSIS — R051 Acute cough: Secondary | ICD-10-CM

## 2021-12-06 DIAGNOSIS — S299XXA Unspecified injury of thorax, initial encounter: Secondary | ICD-10-CM | POA: Diagnosis present

## 2021-12-06 LAB — CBC WITH DIFFERENTIAL/PLATELET
Abs Immature Granulocytes: 0.02 10*3/uL (ref 0.00–0.07)
Basophils Absolute: 0 10*3/uL (ref 0.0–0.1)
Basophils Relative: 0 %
Eosinophils Absolute: 0.1 10*3/uL (ref 0.0–0.5)
Eosinophils Relative: 1 %
HCT: 29.2 % — ABNORMAL LOW (ref 36.0–46.0)
Hemoglobin: 7.8 g/dL — ABNORMAL LOW (ref 12.0–15.0)
Immature Granulocytes: 0 %
Lymphocytes Relative: 30 %
Lymphs Abs: 1.8 10*3/uL (ref 0.7–4.0)
MCH: 19 pg — ABNORMAL LOW (ref 26.0–34.0)
MCHC: 26.7 g/dL — ABNORMAL LOW (ref 30.0–36.0)
MCV: 71.2 fL — ABNORMAL LOW (ref 80.0–100.0)
Monocytes Absolute: 0.4 10*3/uL (ref 0.1–1.0)
Monocytes Relative: 7 %
Neutro Abs: 3.6 10*3/uL (ref 1.7–7.7)
Neutrophils Relative %: 62 %
Platelets: 293 10*3/uL (ref 150–400)
RBC: 4.1 MIL/uL (ref 3.87–5.11)
RDW: 23.2 % — ABNORMAL HIGH (ref 11.5–15.5)
WBC: 5.8 10*3/uL (ref 4.0–10.5)
nRBC: 0 % (ref 0.0–0.2)

## 2021-12-06 LAB — BASIC METABOLIC PANEL
Anion gap: 11 (ref 5–15)
BUN: 10 mg/dL (ref 6–20)
CO2: 23 mmol/L (ref 22–32)
Calcium: 9.3 mg/dL (ref 8.9–10.3)
Chloride: 107 mmol/L (ref 98–111)
Creatinine, Ser: 0.77 mg/dL (ref 0.44–1.00)
GFR, Estimated: >60 mL/min (ref >60)
Glucose, Bld: 103 mg/dL — ABNORMAL HIGH (ref 70–99)
Potassium: 3.8 mmol/L (ref 3.5–5.1)
Sodium: 141 mmol/L (ref 135–145)

## 2021-12-06 LAB — RESP PANEL BY RT-PCR (FLU A&B, COVID) ARPGX2
Influenza A by PCR: NEGATIVE
Influenza B by PCR: NEGATIVE
SARS Coronavirus 2 by RT PCR: NEGATIVE

## 2021-12-06 LAB — D-DIMER, QUANTITATIVE: D-Dimer, Quant: 0.53 ug/mL-FEU — ABNORMAL HIGH (ref 0.00–0.50)

## 2021-12-06 LAB — HCG, QUANTITATIVE, PREGNANCY: hCG, Beta Chain, Quant, S: <1 m[IU]/mL (ref <5)

## 2021-12-06 LAB — TROPONIN I (HIGH SENSITIVITY): Troponin I (High Sensitivity): 2 ng/L (ref <18)

## 2021-12-06 MED ORDER — ALBUTEROL SULFATE HFA 108 (90 BASE) MCG/ACT IN AERS
2.0000 | INHALATION_SPRAY | Freq: Once | RESPIRATORY_TRACT | Status: AC
  Administered 2021-12-06: 2 via RESPIRATORY_TRACT
  Filled 2021-12-06: qty 6.7

## 2021-12-06 MED ORDER — KETOROLAC TROMETHAMINE 60 MG/2ML IM SOLN: 60.0000 mg | Freq: Once | INTRAMUSCULAR | Status: DC

## 2021-12-06 MED ORDER — IOHEXOL 350 MG/ML SOLN
100.0000 mL | Freq: Once | INTRAVENOUS | Status: AC | PRN
  Administered 2021-12-06: 100 mL via INTRAVENOUS

## 2021-12-06 NOTE — ED Provider Notes (Signed)
?[facility] DEPT ?Provider Note ? ? ?CSN: [id number] ?Arrival date & time: 12/06/21  0225 ? ?  ? ?History ? ?Chief Complaint  ?Patient presents with  ? Back Pain  ? Cough  ? ? ?Sally Price is a 46 y.o. female. ? ?The history is provided by the patient.  ?Back Pain ?Cough ?Sally Price is a 46 y.o. female who presents to the Emergency Department complaining of back pain.  She presents to the emergency department complaining of left-sided posterior back pain that wraps around laterally.  This started yesterday when she was turning.  Pain is worse with coughing, breathing.  She does have cough for the last few days.  No fevers, difficulty breathing.  She feels wheezy at times.  Cough is nonproductive.  No associated abdominal pain, leg swelling or pain.  She does have a history of severe anemia and is on iron supplementation.  No history of DVT/PE.  No hormone use.  She did recently fly back and forth to [place], returned on Tuesday.  The flight was about 5 hours. ? ?Uses tobacco ?  ? ?Home Medications ?Prior to Admission medications   ?Medication Sig Start Date End Date Taking? Authorizing Provider  ?Fe Fum-FePoly-Vit C-Vit B3 (INTEGRA) 62.5-62.5-40-3 MG CAPS Take 1 capsule by mouth daily. 11/06/21  Yes [provider]  ?   ? ?Allergies    ?Patient has no known allergies.   ? ?Review of Systems   ?Review of Systems  ?Respiratory:  Positive for cough.   ?Musculoskeletal:  Positive for back pain.  ?All other systems reviewed and are negative. ? ?Physical Exam ?Updated Vital Signs ?BP (!) 128/53 (BP Location: Left Arm)   Pulse 82   Temp 98.1 ?F (36.7 ?C) (Oral)   Resp 16   LMP 12/04/2021   SpO2 100%  ?Physical Exam ?Vitals and nursing note reviewed.  ?Constitutional:   ?   Appearance: She is well-developed.  ?HENT:  ?   Head: Normocephalic and atraumatic.  ?Cardiovascular:  ?   Rate and Rhythm: Normal rate and regular rhythm.  ?   Heart sounds: No murmur heard. ?Pulmonary:   ?   Effort: Pulmonary effort is normal. No respiratory distress.  ?   Breath sounds: Normal breath sounds.  ?   Comments: Posterior chest wall tenderness ?Abdominal:  ?   Palpations: Abdomen is soft.  ?   Tenderness: There is no abdominal tenderness. There is no guarding or rebound.  ?Musculoskeletal:     ?   General: No tenderness.  ?Skin: ?   General: Skin is warm and dry.  ?Neurological:  ?   Mental Status: She is alert and oriented to person, place, and time.  ?Psychiatric:     ?   Behavior: Behavior normal.  ? ? ?ED Results / Procedures / Treatments   ?Labs ?(all labs ordered are listed, but only abnormal results are displayed) ?Labs Reviewed  ?BASIC METABOLIC PANEL - Abnormal; Notable for the following components:  ?    Result Value  ? Glucose, Bld 103 (*)   ? All other components within normal limits  ?CBC WITH DIFFERENTIAL/PLATELET - Abnormal; Notable for the following components:  ? Hemoglobin 7.8 (*)   ? HCT 29.2 (*)   ? MCV 71.2 (*)   ? MCH 19.0 (*)   ? MCHC 26.7 (*)   ? RDW 23.2 (*)   ? All other components within normal limits  ?D-DIMER, QUANTITATIVE - Abnormal; Notable for the following components:  ?  D-Dimer, Quant 0.53 (*)   ? All other components within normal limits  ?RESP PANEL BY RT-PCR (FLU A&B, COVID) ARPGX2  ?HCG, QUANTITATIVE, PREGNANCY  ?TROPONIN I (HIGH SENSITIVITY)  ? ? ?EKG ?EKG Interpretation ? ?Date/Time:  Sunday Dec 06 2021 03:25:07 EDT ?Ventricular Rate:  89 ?PR Interval:  205 ?QRS Duration: 101 ?QT Interval:  371 ?QTC Calculation: 452 ?R Axis:   -38 ?Text Interpretation: Sinus rhythm Borderline prolonged PR interval Left axis deviation Low voltage, precordial leads Abnormal R-wave progression, late transition Borderline T abnormalities, anterior leads No previous ECGs available Confirmed by Quintella Reichert [phone]) on 12/06/2021 3:54:53 AM ? ?Radiology ?DG Chest 2 View ? ?Result Date: 12/06/2021 ?CLINICAL DATA:  Cough, back pain EXAM: CHEST - 2 VIEW COMPARISON:  06/03/2013 FINDINGS:  Linear densities in both lung bases could reflect atelectasis or scarring. Heart is normal size. No effusions or acute bony abnormality. IMPRESSION: Bibasilar atelectasis or scarring.  No active disease. Electronically Signed   By: Rolm Baptise M.D.   On: 12/06/2021 03:38  ? ?CT Angio Chest PE W/Cm &/Or Wo Cm ? ?Result Date: 12/06/2021 ?CLINICAL DATA:  Positive D-dimer with back pain and cough. Pulmonary embolism suspected. EXAM: CT ANGIOGRAPHY CHEST WITH CONTRAST TECHNIQUE: Multidetector CT imaging of the chest was performed using the standard protocol during bolus administration of intravenous contrast. Multiplanar CT image reconstructions and MIPs were obtained to evaluate the vascular anatomy. RADIATION DOSE REDUCTION: This exam was performed according to the departmental dose-optimization program which includes automated exposure control, adjustment of the mA and/or kV according to patient size and/or use of iterative reconstruction technique. CONTRAST:  131m OMNIPAQUE IOHEXOL 350 MG/ML SOLN COMPARISON:  Radiography from earlier today FINDINGS: Cardiovascular: Satisfactory opacification of the pulmonary arteries to the segmental level. No evidence of pulmonary embolism. Normal heart size. No pericardial effusion. Mediastinum/Nodes: Negative for adenopathy or mass Lungs/Pleura: There is no edema, consolidation, effusion, or pneumothorax. Mild bilateral atelectasis Upper Abdomen: Negative Musculoskeletal: Negative Review of the MIP images confirms the above findings. IMPRESSION: Negative for pulmonary embolism or other acute finding. Electronically Signed   By: JJorje GuildM.D.   On: 12/06/2021 06:29   ? ?Procedures ?Procedures  ? ? ?Medications Ordered in ED ?Medications  ?albuterol (VENTOLIN HFA) 108 (90 Base) MCG/ACT inhaler 2 puff (2 puffs Inhalation Given 12/06/21 0435)  ?iohexol (OMNIPAQUE) 350 MG/ML injection 100 mL (100 mLs Intravenous Contrast Given 12/06/21 0554)  ? ? ?ED Course/ Medical Decision  Making/ A&P ?  ?                        ?Medical Decision Making ?Amount and/or Complexity of Data Reviewed ?Labs: ordered. ?Radiology: ordered. ? ?Risk ?Prescription drug management. ? ? ?Patient with history of iron deficiency anemia here for evaluation of left-sided back pain, cough.  No significant wheezing on evaluation but patient reports wheezing prior to arrival, she was treated with albuterol with partial improvement in her symptoms.  CBC with anemia, improved compared to labs obtained in November.  She does have planned follow-up for this in July and she is on oral iron supplementation.  Given her recent air travel, tobacco use a D-dimer was obtained, which was mildly elevated.  CTA was obtained, which is negative for PE or pneumonia.  Images personally reviewed.  Discussed with patient findings of labs.  Suspect her pain is secondary to musculoskeletal pain.  Discussed home care for musculoskeletal back pain.  Clinical picture is not consistent with ACS, dissection,  pneumonia.  Plan to discharge home with supportive treatments, outpatient follow-up and return precautions. ? ? ? ? ? ? ? ?Final Clinical Impression(s) / ED Diagnoses ?Final diagnoses:  ?Strain of thoracic back region  ?Acute cough  ?Iron deficiency anemia due to chronic blood loss  ? ? ?Rx / DC Orders ?ED Discharge Orders   ? ? None  ? ?  ? ? ?  ?Quintella Reichert, MD ?12/06/21 [phone] ? ?

## 2021-12-06 NOTE — ED Triage Notes (Addendum)
Pt reports with back pain when taking deep breaths and cough since yesterday. Pt states that she does smoke cigarettes.  ?

## 2021-12-06 NOTE — ED Notes (Signed)
Urine in lab 

## 2021-12-06 NOTE — ED Notes (Signed)
Pt to radiology.

## 2021-12-06 NOTE — ED Notes (Signed)
Pt to CT scan.

## 2022-04-02 ENCOUNTER — Other Ambulatory Visit: Payer: Self-pay | Admitting: Obstetrics and Gynecology

## 2022-04-02 DIAGNOSIS — D251 Intramural leiomyoma of uterus: Secondary | ICD-10-CM

## 2022-04-06 ENCOUNTER — Other Ambulatory Visit: Payer: No Typology Code available for payment source

## 2022-04-12 ENCOUNTER — Other Ambulatory Visit: Payer: Self-pay | Admitting: Interventional Radiology

## 2022-04-12 ENCOUNTER — Ambulatory Visit
Admission: RE | Admit: 2022-04-12 | Discharge: 2022-04-12 | Disposition: A | Discharge status: Home / Self Care | Payer: No Typology Code available for payment source | Source: Ambulatory Visit | Attending: Obstetrics and Gynecology | Admitting: Obstetrics and Gynecology

## 2022-04-12 DIAGNOSIS — D251 Intramural leiomyoma of uterus: Secondary | ICD-10-CM

## 2022-04-12 HISTORY — PX: IR RADIOLOGIST EVAL & MGMT: IMG5224

## 2022-04-12 NOTE — Consult Note (Signed)
Chief Complaint: Patient was seen in consultation today for symptomatic uterine fibroids at the request of Cousins,Sheronette  Referring Physician(s): Cousins,Sheronette  History of Present Illness: Sally Price is a 46 y.o. female G0, P0 with indeterminate plans for future pregnancy, who was diagnosed with uterine fibroids more than 10 years ago.  She currently is having significant bleeding symptoms including anemia.  Her last menstrual period was 03/25/2022.  Cycles are q. 28 days, lasting 10 to 11 days with 6 days of heavy bleeding requiring changing of pads and adult diapers.  She has no interperiod Bleeding.  She has had significant edema anemia with hemoglobin below 3 requiring hospitalization and transfusion.  She had myomectomy by Dr. Ronita Hipps on 05/13/2011.  Pap smear 02/25/2022 was negative.  Ultrasound dated 3/23 demonstrated 9 uterine fibroids.  She uses Aleve for pain control as needed.  No significant bulk symptoms.  Past Medical History:  Diagnosis Date   Abnormal Pap smear 12/2012   Abscess of left leg    No pertinent past medical history    PCOS (polycystic ovarian syndrome)     Past Surgical History:  Procedure Laterality Date   DILATION AND CURETTAGE OF UTERUS  2009   HYSTEROSCOPY WITH D & C  05/13/2011   Procedure: DILATATION AND CURETTAGE (D&C) /HYSTEROSCOPY;  Surgeon: Lovenia Kim, MD;  Location: [facility];  Service: Gynecology;;   UTERINE FIBROID SURGERY  2012    Allergies: Patient has no known allergies.  Medications: Prior to Admission medications   Medication Sig Start Date End Date Taking? Authorizing Provider  Fe Fum-FePoly-Vit C-Vit B3 (INTEGRA) 62.5-62.5-40-3 MG CAPS Take 1 capsule by mouth daily. 11/06/21   [provider]     No family history on file.  Social History   Socioeconomic History   Marital status: Single    Spouse name: Not on file   Number of children: Not on file   Years of education: Not on file   Highest  education level: Not on file  Occupational History   Not on file  Tobacco Use   Smoking status: Every Day    Packs/day: 0.50    Years: 10.00    Total pack years: 5.00    Types: Cigarettes   Smokeless tobacco: Never  Vaping Use   Vaping Use: Never used  Substance and Sexual Activity   Alcohol use: Yes    Comment: occasionally   Drug use: No   Sexual activity: Yes    Birth control/protection: Condom  Other Topics Concern   Not on file  Social History Narrative   Not on file   Social Determinants of Health   Financial Resource Strain: Not on file  Food Insecurity: Not on file  Transportation Needs: Not on file  Physical Activity: Not on file  Stress: Not on file  Social Connections: Not on file  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  Has boyfriend, indeterminate plans for future pregnancy.  ECOG Status: 1 - Symptomatic but completely ambulatory  Review of Systems: A 12 point ROS discussed and pertinent positives are indicated in the HPI above.  All other systems are negative.  Review of Systems  Vital Signs: There were no vitals taken for this visit. Ht 5'3" Wt 197#   Physical Exam Constitutional: Oriented to person, place, and time. Well-developed and well-nourished. No distress.   HENT:  Head: Normocephalic and atraumatic.  Eyes: Conjunctivae and EOM are normal. Right eye exhibits no discharge. Left eye exhibits no  discharge. No scleral icterus.  Neck: No JVD present.  Pulmonary/Chest: Effort normal. No stridor. No respiratory distress.  Abdomen:  obese, non distended Neurological:  alert and oriented to person, place, and time.  Skin: Skin is warm and dry.  not diaphoretic.  Psychiatric:   normal mood and affect.   behavior is normal. Judgment and thought content normal.      Imaging: No results found.  Labs:  CBC: Recent Labs    06/04/21 1920 12/06/21 0420  WBC 12.6* 5.8  HGB 5.5* 7.8*  HCT 22.9* 29.2*  PLT 736* 293    COAGS: No results for  input(s): "INR", "APTT" in the last 8760 hours.  BMP: Recent Labs    06/04/21 1920 12/06/21 0420  NA 140 141  K 3.4* 3.8  CL 109 107  CO2 24 23  GLUCOSE 124* 103*  BUN 8 10  CALCIUM 9.0 9.3  CREATININE 0.68 0.77  GFRNONAA >60 >60    LIVER FUNCTION TESTS: Recent Labs    06/04/21 1920  BILITOT 0.4  AST 17  ALT 17  ALKPHOS 53  PROT 7.3  ALBUMIN 4.2    TUMOR MARKERS: No results for input(s): "AFPTM", "CEA", "CA199", "CHROMGRNA" in the last 8760 hours.  Assessment and Plan: My impression is that this patient's menorrhagia is almost certainly secondary to uterine fibroids. We spent the majority of the consultation discussing the pathophysiology of uterine leiomyomata, natural history, anticipated  involution post menopause, and treatment options. We discussed myomectomy, hysterectomy, and uterine fibroid embolization. I described the technique of UFE, anticipated benefits, possible risks and complications including but not limited to bleeding, infection, vessel damage, nontarget embolization, and incomplete symptom relief. We discussed the 90% clinical success rate historically and at our experience with UFE. We discussed the post procedure course and time course of symptom resolution. We discussed the need for continued gynecologic care.  We discussed that UFE does not represent a fertility treatment, although many cases of successful full-term pregnancy post UFE have been documented.  She seemed to understand and did ask appropriate questions, which were answered.  Based on her evaluation thus far, I think she would be an appropriate candidate for uterine fibroid embolization because of her symptomatology and  uterine fibroids. To complete her evaluation and workup, I would require pelvic MRI with contrast to best determine the exact site of location of her uterine fibroids, specifically to exclude any pedunculated fibroids on a narrow stalk, as well as to exclude any unexpected  pelvic pathology.    After our discussion, the patient was motivated proceed. Accordingly, we can set up the MRI   at her convenience as an outpatient. If this looks okay, she can confirm whether she wants   to proceed with UFE.    Thank you for this interesting consult.  I greatly enjoyed meeting [facility] and look forward to participating in their care.  A copy of this report was sent to the requesting provider on this date.  Electronically Signed: Rickard Rhymes 04/12/2022, 10:23 AM   I spent a total of   40 Minutes   in face to face in clinical consultation, greater than 50% of which was counseling/coordinating care for symptomatic uterine fibroids.

## 2022-04-20 ENCOUNTER — Ambulatory Visit (HOSPITAL_COMMUNITY): Payer: No Typology Code available for payment source

## 2022-05-03 ENCOUNTER — Ambulatory Visit (HOSPITAL_COMMUNITY): Payer: No Typology Code available for payment source

## 2022-05-03 ENCOUNTER — Encounter (HOSPITAL_COMMUNITY): Payer: Self-pay

## 2022-07-24 IMAGING — CT CT ANGIO CHEST
2 of 6 series · 18 of 36 positions shown · IV contrast (agent unspecified)
Comparison: Radiography from earlier today

CLINICAL DATA: Positive D-dimer with back pain and cough. Pulmonary
embolism suspected.

EXAM:
CT ANGIOGRAPHY CHEST WITH CONTRAST
TECHNIQUE: Multidetector CT imaging of the chest was performed using the
standard protocol during bolus administration of intravenous
contrast. Multiplanar CT image reconstructions and MIPs were
obtained to evaluate the vascular anatomy.

[Series 5: thins · axial · 0.60mm/px · z∈[+1352,+1576]mm · 17 of 253 slices shown]
[im 15/253  lung]
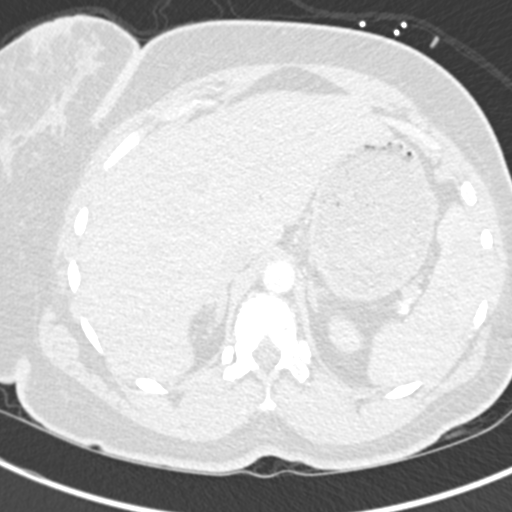
[im 29/253  mediastinal]
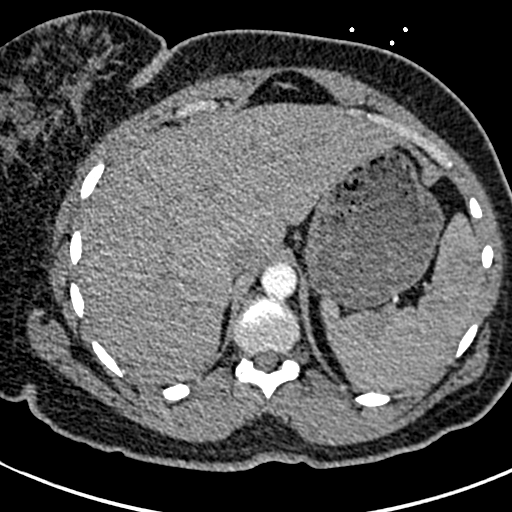
[im 43/253  lung]
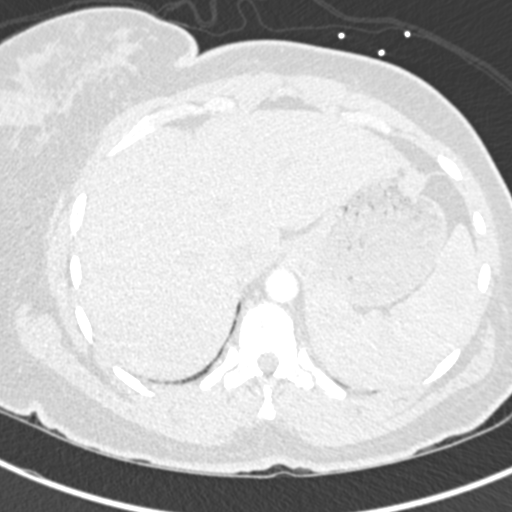
[im 57/253  mediastinal]
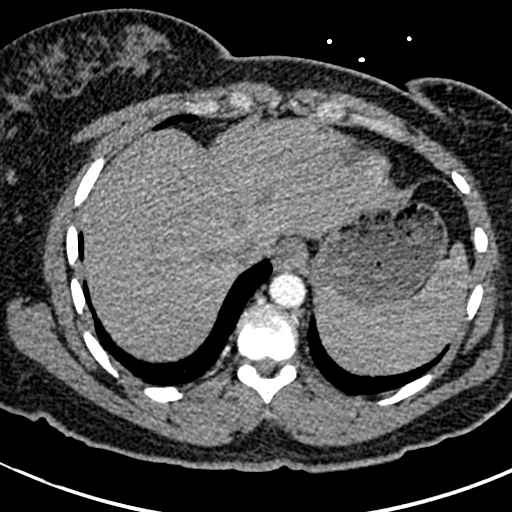
[im 71/253  lung]
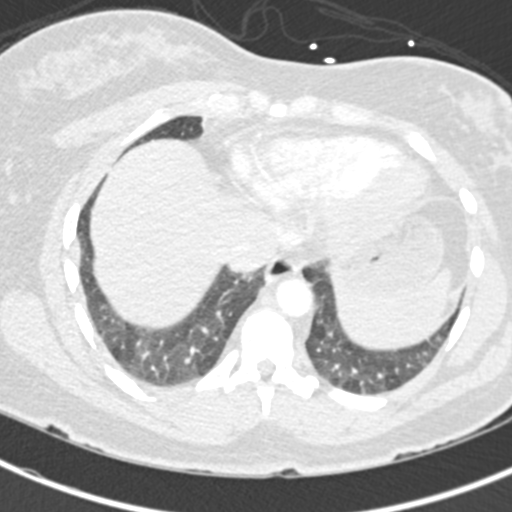
[im 85/253  mediastinal]
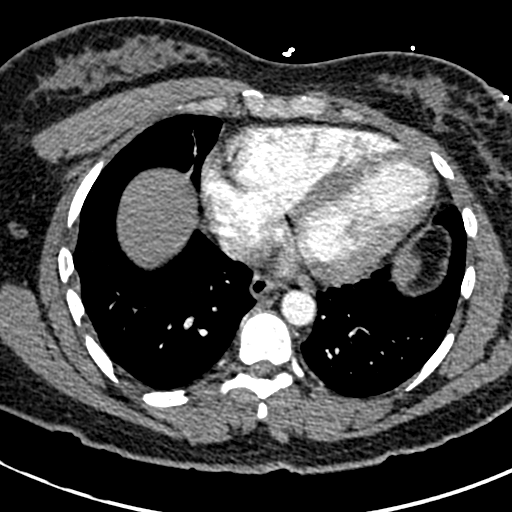
[im 99/253  lung]
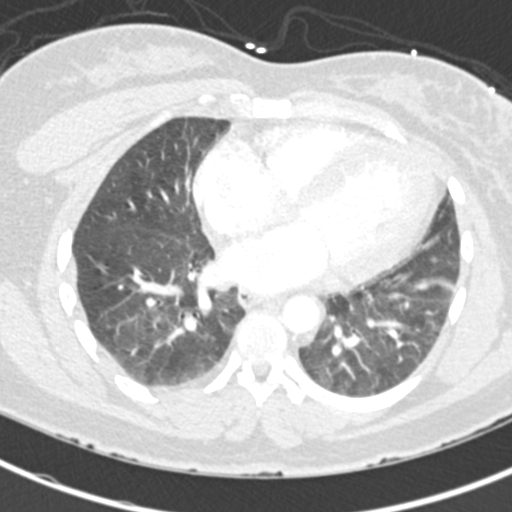
[im 113/253  mediastinal]
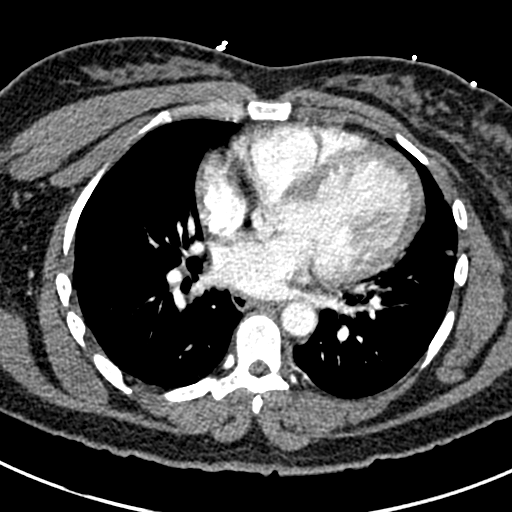
[im 127/253  lung]
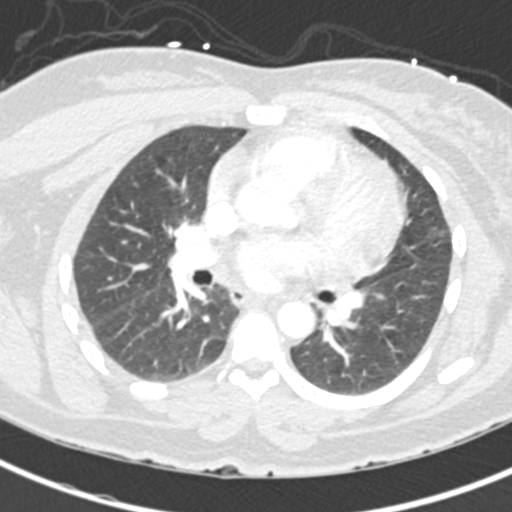
[im 141/253  mediastinal]
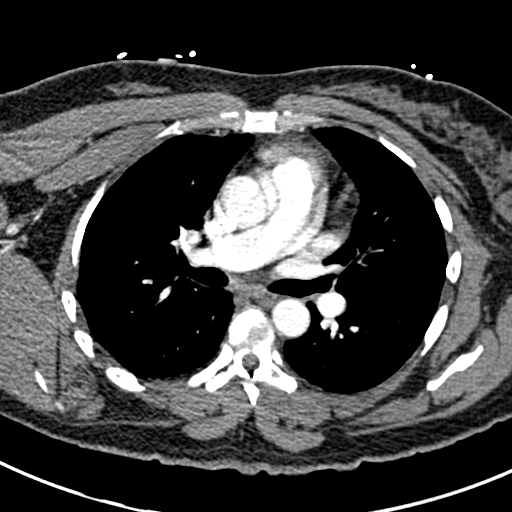
[im 155/253  lung]
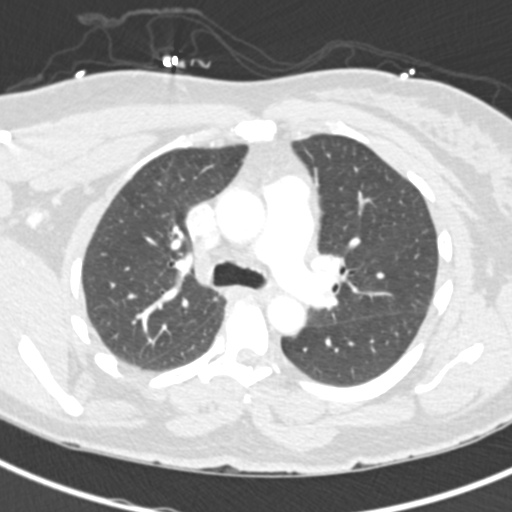
[im 169/253  mediastinal]
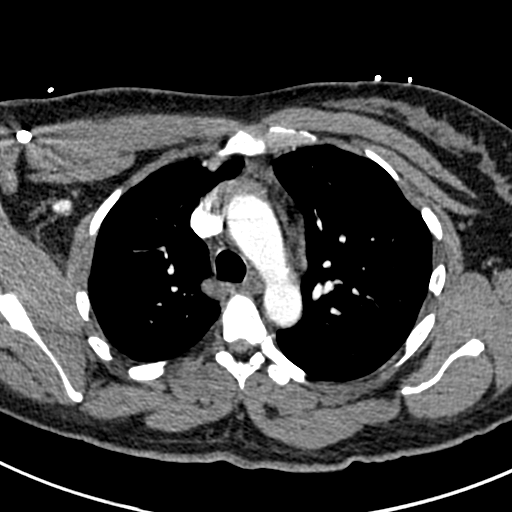
[im 183/253  lung]
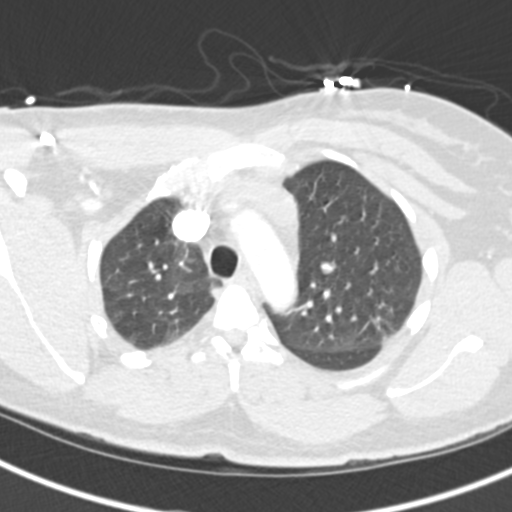
[im 197/253  mediastinal]
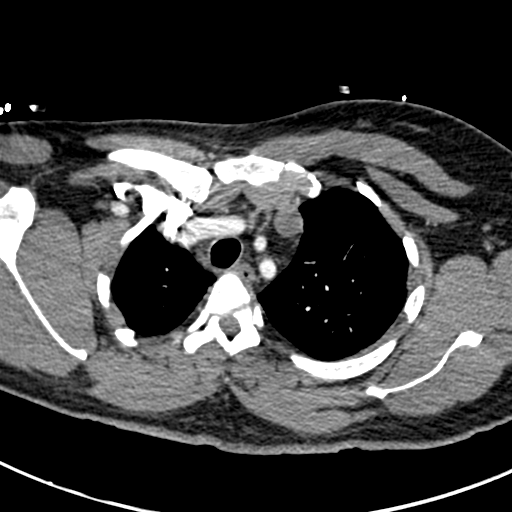
[im 211/253  lung]
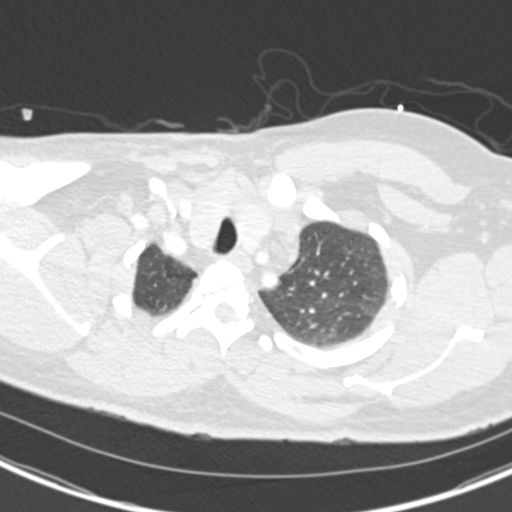
[im 225/253  mediastinal]
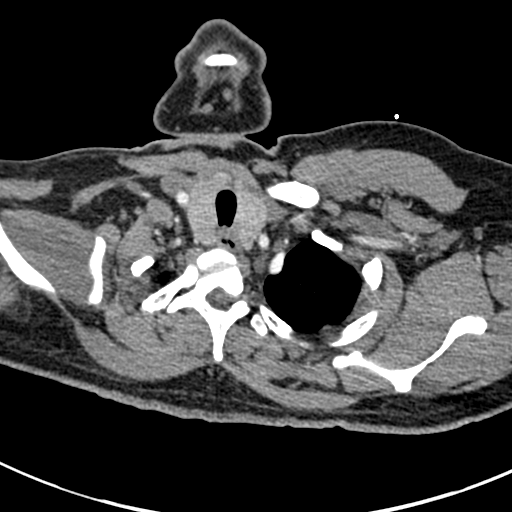
[im 239/253  lung]
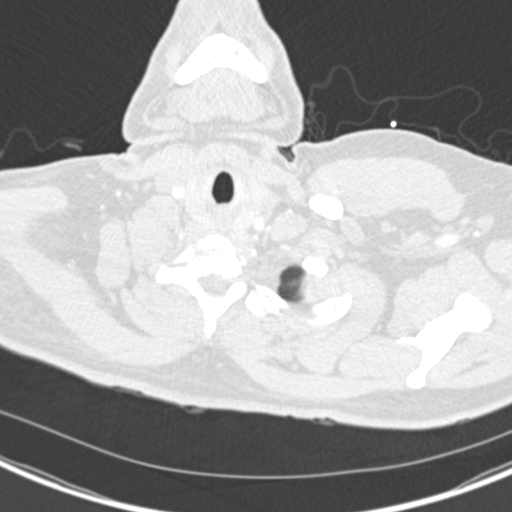

[Series 7: coronal mpr · coronal · 0.57mm/px · 1 of 140 slices shown]
[im 70/140  mediastinal]
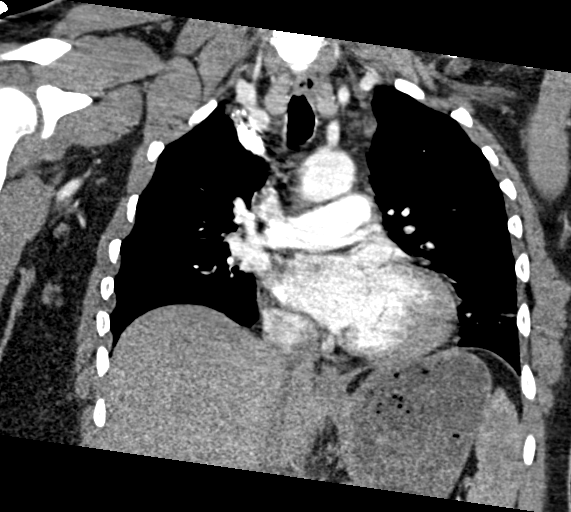

[18 of 36 positions shown; findings below may reference images not displayed]

RADIATION DOSE REDUCTION: This exam was performed according to the
departmental dose-optimization program which includes automated
exposure control, adjustment of the mA and/or kV according to
patient size and/or use of iterative reconstruction technique.

CONTRAST:  100mL OMNIPAQUE IOHEXOL 350 MG/ML SOLN
FINDINGS: Cardiovascular: Satisfactory opacification of the pulmonary arteries
to the segmental level. No evidence of pulmonary embolism. Normal
heart size. No pericardial effusion.

Mediastinum/Nodes: Negative for adenopathy or mass

Lungs/Pleura: There is no edema, consolidation, effusion, or
pneumothorax. Mild bilateral atelectasis

Upper Abdomen: Negative

Musculoskeletal: Negative

Review of the MIP images confirms the above findings.
IMPRESSION: Negative for pulmonary embolism or other acute finding.

## 2023-01-23 ENCOUNTER — Emergency Department (HOSPITAL_COMMUNITY)
Admission: EM | Admit: 2023-01-23 | Discharge: 2023-01-23 | Disposition: A | Discharge status: Home / Self Care | Payer: No Typology Code available for payment source | Attending: Emergency Medicine | Admitting: Emergency Medicine

## 2023-01-23 ENCOUNTER — Encounter (HOSPITAL_COMMUNITY): Payer: Self-pay

## 2023-01-23 DIAGNOSIS — D649 Anemia, unspecified: Secondary | ICD-10-CM | POA: Insufficient documentation

## 2023-01-23 DIAGNOSIS — R531 Weakness: Secondary | ICD-10-CM | POA: Diagnosis present

## 2023-01-23 LAB — FERRITIN: Ferritin: 3 ng/mL — ABNORMAL LOW (ref 11–307)

## 2023-01-23 LAB — BASIC METABOLIC PANEL
Anion gap: 8 (ref 5–15)
BUN: 6 mg/dL (ref 6–20)
CO2: 20 mmol/L — ABNORMAL LOW (ref 22–32)
Calcium: 9.1 mg/dL (ref 8.9–10.3)
Chloride: 109 mmol/L (ref 98–111)
Creatinine, Ser: 0.54 mg/dL (ref 0.44–1.00)
GFR, Estimated: >60 mL/min (ref >60)
Glucose, Bld: 83 mg/dL (ref 70–99)
Potassium: 3.3 mmol/L — ABNORMAL LOW (ref 3.5–5.1)
Sodium: 137 mmol/L (ref 135–145)

## 2023-01-23 LAB — RETICULOCYTES
Immature Retic Fract: 22.8 % — ABNORMAL HIGH (ref 2.3–15.9)
RBC.: 2.88 MIL/uL — ABNORMAL LOW (ref 3.87–5.11)
Retic Count, Absolute: 47.2 10*3/uL (ref 19.0–186.0)
Retic Ct Pct: 1.6 % (ref 0.4–3.1)

## 2023-01-23 LAB — IRON AND TIBC
Iron: 10 ug/dL — ABNORMAL LOW (ref 28–170)
Saturation Ratios: 2 % — ABNORMAL LOW (ref 10.4–31.8)
TIBC: 535 ug/dL — ABNORMAL HIGH (ref 250–450)
UIBC: 525 ug/dL

## 2023-01-23 LAB — CBC
HCT: 18.3 % — ABNORMAL LOW (ref 36.0–46.0)
Hemoglobin: 4.3 g/dL — CL (ref 12.0–15.0)
MCH: 15 pg — ABNORMAL LOW (ref 26.0–34.0)
MCHC: 23.5 g/dL — ABNORMAL LOW (ref 30.0–36.0)
MCV: 63.8 fL — ABNORMAL LOW (ref 80.0–100.0)
Platelets: 154 10*3/uL (ref 150–400)
RBC: 2.87 MIL/uL — ABNORMAL LOW (ref 3.87–5.11)
RDW: 25.5 % — ABNORMAL HIGH (ref 11.5–15.5)
WBC: 6.4 10*3/uL (ref 4.0–10.5)
nRBC: 0 % (ref 0.0–0.2)

## 2023-01-23 LAB — ABO/RH: ABO/RH(D): O POS

## 2023-01-23 LAB — FOLATE: Folate: 6.3 ng/mL (ref >5.9)

## 2023-01-23 LAB — PREPARE RBC (CROSSMATCH)

## 2023-01-23 LAB — VITAMIN B12: Vitamin B-12: 201 pg/mL (ref 180–914)

## 2023-01-23 MED ORDER — SODIUM CHLORIDE 0.9% IV SOLUTION: Freq: Once | INTRAVENOUS | Status: DC

## 2023-01-23 NOTE — ED Provider Notes (Signed)
[facility] Provider Note   CSN: [id number] Arrival date & time: 01/23/23  4098     History  Chief Complaint  Patient presents with   Abnormal Lab    Sally Price is a 47 y.o. female presenting to the emergency department complaining of generalized weakness and fatigue.  Patient reports history of chronic anemia due to abnormal uterine bleeding and large uterine fibroids.  She follows with OB/GYN and is currently in the process of getting set up for hysterectomy, which is not scheduled.  She had outpatient labs done this week and was told that her "hemoglobin was 4".  She reports she has felt slightly more fatigued than normal, but denies lightheadedness or dyspnea on exertion.  She is still able to perform daily activities.  She has needed blood transfusions in the past, and is prescribed oral iron, but says she is not taking it regularly because it upsets her stomach.  She is not actively on her menstrual cycle and not having active vaginal bleeding  HPI     Home Medications Prior to Admission medications   Not on File      Allergies    Patient has no known allergies.    Review of Systems   Review of Systems  Physical Exam Updated Vital Signs BP 115/60   Pulse 88   Temp 98.5 F (36.9 C) (Oral)   Resp 18   LMP 01/07/2023   SpO2 100%  Physical Exam Constitutional:      General: She is not in acute distress. HENT:     Head: Normocephalic and atraumatic.  Eyes:     Conjunctiva/sclera: Conjunctivae normal.     Pupils: Pupils are equal, round, and reactive to light.  Cardiovascular:     Rate and Rhythm: Normal rate and regular rhythm.  Pulmonary:     Effort: Pulmonary effort is normal. No respiratory distress.  Abdominal:     General: There is no distension.     Tenderness: There is no abdominal tenderness.  Skin:    General: Skin is warm and dry.  Neurological:     General: No focal deficit present.     Mental  Status: She is alert. Mental status is at baseline.  Psychiatric:        Mood and Affect: Mood normal.        Behavior: Behavior normal.     ED Results / Procedures / Treatments   Labs (all labs ordered are listed, but only abnormal results are displayed) Labs Reviewed  CBC - Abnormal; Notable for the following components:      Result Value   RBC 2.87 (*)    Hemoglobin 4.3 (*)    HCT 18.3 (*)    MCV 63.8 (*)    MCH 15.0 (*)    MCHC 23.5 (*)    RDW 25.5 (*)    All other components within normal limits  BASIC METABOLIC PANEL - Abnormal; Notable for the following components:   Potassium 3.3 (*)    CO2 20 (*)    All other components within normal limits  IRON AND TIBC - Abnormal; Notable for the following components:   Iron 10 (*)    TIBC 535 (*)    Saturation Ratios 2 (*)    All other components within normal limits  FERRITIN - Abnormal; Notable for the following components:   Ferritin 3 (*)    All other components within normal limits  RETICULOCYTES - Abnormal; Notable  for the following components:   RBC. 2.88 (*)    Immature Retic Fract 22.8 (*)    All other components within normal limits  VITAMIN B12  FOLATE  TYPE AND SCREEN  PREPARE RBC (CROSSMATCH)  ABO/RH    EKG None  Radiology No results found.  Procedures .Critical Care  Performed by: Terald Sleeper, MD Authorized by: Terald Sleeper, MD   Critical care provider statement:    Critical care time (minutes):  45   Critical care time was exclusive of:  Separately billable procedures and treating other patients   Critical care was necessary to treat or prevent imminent or life-threatening deterioration of the following conditions:  Circulatory failure   Critical care was time spent personally by me on the following activities:  Ordering and performing treatments and interventions, ordering and review of laboratory studies, ordering and review of radiographic studies, pulse oximetry, review of old  charts, examination of patient and evaluation of patient's response to treatment Comments:     Blood transfusion for symptomatic anemia     Medications Ordered in ED Medications  0.9 %  sodium chloride infusion (Manually program via Guardrails IV Fluids) (has no administration in time range)    ED Course/ Medical Decision Making/ A&P Clinical Course as of 01/23/23 1459  Sun Jan 23, 2023  1459 Patient is signed out to evening EDP team pending completion of blood transfusions.  If she remains asymptomatic with no other complaints, she would be stable for discharge afterwards. [MT]    Clinical Course User Index [MT] Anastasha Ortez, Kermit Balo, MD                             Medical Decision Making Amount and/or Complexity of Data Reviewed Labs: ordered.  Risk Prescription drug management.   This patient presents to the ED with concern for fatigue. This involves an extensive number of treatment options, and is a complaint that carries with it a high risk of complications and morbidity.  The differential diagnosis includes symptomatic anemia versus dehydration versus other  Co-morbidities that complicate the patient evaluation: Enlarged uterine fibroids and history of normal uterine bleeding at high risk of anemia  I ordered and personally interpreted labs.  The pertinent results include:  hgb 4.4, microcytic, consistent with chronic iron deficiency anemia.   The patient was maintained on a cardiac monitor.  I personally viewed and interpreted the cardiac monitored which showed an underlying rhythm of: NSR  The patient was verbally consented for blood transfusion.  We discussed the risk and benefits of transfusion, with the risk including infection, blood transfusion reaction, respiratory failure, stroke and death.  We discussed alternative options which would include iron transfusion and oral iron.  The patient is in agreement to proceed with blood transfusion at this time.  I have ordered 2  units of blood.  I have reviewed the patients home medicines and have made adjustments as needed  Test Considered: No indication for emergent CT imaging of the abdomen or pelvic ultrasound at this time.  No active bleeding concerns.  After the interventions noted above, I reevaluated the patient and found that they have: improved  Social Determinants of Health: patient encouraged to maintain herself on oral iron at home which she had not been taking, and to follow-up with her OB/GYN.  Dispostion:  After consideration of the diagnostic results and the patients response to treatment, I feel that the patent would benefit  from close OB/GYN follow-up.         Final Clinical Impression(s) / ED Diagnoses Final diagnoses:  Symptomatic anemia    Rx / DC Orders ED Discharge Orders     None         Nilsa Macht, Kermit Balo, MD 01/23/23 1459

## 2023-01-23 NOTE — ED Notes (Signed)
Blood bank has 2 units of blood ready for this pt.Notified Sara,RN.

## 2023-01-23 NOTE — Discharge Instructions (Addendum)
Please follow-up with your OB/GYN office for your anemia.  We gave you 2 units transfusion of blood in the ER.  I strongly recommend you continue taking the iron pills that were prescribed to you which can help your body replenish blood supply.  If you begin to feel lightheaded, dizzy, short of breath, please return to the ER.

## 2023-01-23 NOTE — ED Triage Notes (Signed)
Pt arrived via POV, states she was sent for abnormal labs. States hemoglobin drawn Thursday was around 4. Was having follow up labs with OBGYN. Hx of heavy vaginal bleeding and needing multiple transfusions in the past. Feeling extremely fatigued but denies any dizziness at this time.

## 2023-01-24 LAB — BPAM RBC
Blood Product Expiration Date: 202407252359
Blood Product Expiration Date: 202407262359
ISSUE DATE / TIME: 202406301158
ISSUE DATE / TIME: 202406301423
Unit Type and Rh: 5100
Unit Type and Rh: 5100

## 2023-01-24 LAB — TYPE AND SCREEN
ABO/RH(D): O POS
Antibody Screen: NEGATIVE
Unit division: 0
Unit division: 0

## 2023-06-06 ENCOUNTER — Other Ambulatory Visit: Payer: Self-pay | Admitting: Family Medicine

## 2023-06-06 DIAGNOSIS — E079 Disorder of thyroid, unspecified: Secondary | ICD-10-CM

## 2023-06-07 ENCOUNTER — Inpatient Hospital Stay (HOSPITAL_COMMUNITY): Payer: No Typology Code available for payment source

## 2023-06-07 ENCOUNTER — Observation Stay (HOSPITAL_COMMUNITY)
Admission: AD | Admit: 2023-06-07 | Discharge: 2023-06-08 | DRG: 812 | Disposition: A | Discharge status: Home / Self Care | Payer: No Typology Code available for payment source | Attending: Family Medicine | Admitting: Family Medicine

## 2023-06-07 ENCOUNTER — Other Ambulatory Visit: Payer: Self-pay | Admitting: Family Medicine

## 2023-06-07 ENCOUNTER — Encounter (HOSPITAL_COMMUNITY): Payer: Self-pay | Admitting: Family Medicine

## 2023-06-07 DIAGNOSIS — D500 Iron deficiency anemia secondary to blood loss (chronic): Secondary | ICD-10-CM | POA: Diagnosis present

## 2023-06-07 DIAGNOSIS — D259 Leiomyoma of uterus, unspecified: Secondary | ICD-10-CM | POA: Insufficient documentation

## 2023-06-07 DIAGNOSIS — D649 Anemia, unspecified: Principal | ICD-10-CM | POA: Diagnosis present

## 2023-06-07 DIAGNOSIS — F17210 Nicotine dependence, cigarettes, uncomplicated: Secondary | ICD-10-CM | POA: Diagnosis present

## 2023-06-07 DIAGNOSIS — E041 Nontoxic single thyroid nodule: Secondary | ICD-10-CM | POA: Diagnosis not present

## 2023-06-07 DIAGNOSIS — D251 Intramural leiomyoma of uterus: Secondary | ICD-10-CM | POA: Diagnosis present

## 2023-06-07 LAB — RETICULOCYTES
Immature Retic Fract: 11.7 % (ref 2.3–15.9)
RBC.: 2.84 MIL/uL — ABNORMAL LOW (ref 3.87–5.11)
Retic Count, Absolute: 32.1 10*3/uL (ref 19.0–186.0)
Retic Ct Pct: 1.1 % (ref 0.4–3.1)

## 2023-06-07 LAB — CBC
HCT: 17.6 % — ABNORMAL LOW (ref 36.0–46.0)
Hemoglobin: 4.1 g/dL — CL (ref 12.0–15.0)
MCH: 14.2 pg — ABNORMAL LOW (ref 26.0–34.0)
MCHC: 23.3 g/dL — ABNORMAL LOW (ref 30.0–36.0)
MCV: 60.9 fL — ABNORMAL LOW (ref 80.0–100.0)
Platelets: 236 10*3/uL (ref 150–400)
RBC: 2.89 MIL/uL — ABNORMAL LOW (ref 3.87–5.11)
RDW: 24.1 % — ABNORMAL HIGH (ref 11.5–15.5)
WBC: 5.3 10*3/uL (ref 4.0–10.5)
nRBC: 0 % (ref 0.0–0.2)

## 2023-06-07 LAB — IRON AND TIBC
Iron: 13 ug/dL — ABNORMAL LOW (ref 28–170)
Saturation Ratios: 2 % — ABNORMAL LOW (ref 10.4–31.8)
TIBC: 553 ug/dL — ABNORMAL HIGH (ref 250–450)
UIBC: 540 ug/dL

## 2023-06-07 LAB — FERRITIN: Ferritin: 2 ng/mL — ABNORMAL LOW (ref 11–307)

## 2023-06-07 LAB — FOLATE: Folate: 8.9 ng/mL (ref >5.9)

## 2023-06-07 LAB — VITAMIN B12: Vitamin B-12: 298 pg/mL (ref 180–914)

## 2023-06-07 LAB — PREPARE RBC (CROSSMATCH)

## 2023-06-07 MED ORDER — ACETAMINOPHEN 325 MG PO TABS
650.0000 mg | ORAL_TABLET | ORAL | Status: DC | PRN
Start: 2023-06-07 — End: 2023-06-08

## 2023-06-07 MED ORDER — SODIUM CHLORIDE 0.9% IV SOLUTION: Freq: Once | INTRAVENOUS | Status: AC

## 2023-06-07 MED ORDER — CALCIUM CARBONATE ANTACID 500 MG PO CHEW
2.0000 | CHEWABLE_TABLET | ORAL | Status: DC | PRN
Start: 2023-06-07 — End: 2023-06-08

## 2023-06-07 MED ORDER — DOCUSATE SODIUM 100 MG PO CAPS: 100.0000 mg | ORAL_CAPSULE | Freq: Every day | ORAL | Status: DC

## 2023-06-07 MED ORDER — ZOLPIDEM TARTRATE 5 MG PO TABS: 5.0000 mg | ORAL_TABLET | Freq: Every evening | ORAL | Status: DC | PRN

## 2023-06-07 NOTE — H&P (Signed)
Obstetrics and Gynecology History and Physical  Sally Price is a 47 y.o. G0P0000 w/ hx of fibroids, hx of fibroid removal 2012, and chronic HMB exacerbated over the last 6 months presents for direct admit and blood transfusion due to hgb 4.3 from OV 06/06/23. She denies ongoing vaginal bleeding at this time. Previously her periods would last 5 days, now it last 9-10 days with all 10 days with heavy bleeding. She was previously in the midst of discussing hysterectomy with her prior OBGYN. She states her prior work up revealed a PCOS, a fibroid uterus and a negative EMB.  She recieved a tranfusion 2 months ago and she is taking oral iron. She has also noticed a knot or bulge in her stomach and believe this is a growing fibroid. Endorses fatigue, night sweats, bloating and constipation. Of note, she was given aygestin to help with the heavy bleeding and it did not work. She was given a sample of slynd to help with HMB while waiting for follow up pelvic US. She has not started slynd.   Past Medical History:  Diagnosis Date   Abnormal Pap smear 12/2012   Abscess of left leg    No pertinent past medical history    PCOS (polycystic ovarian syndrome)    Past Surgical History:  Procedure Laterality Date   DILATION AND CURETTAGE OF UTERUS  2009   HYSTEROSCOPY WITH D & C  05/13/2011   Procedure: DILATATION AND CURETTAGE (D&C) /HYSTEROSCOPY;  Surgeon: Lenoard Aden, MD;  Location: [facility];  Service: Gynecology;;   IR RADIOLOGIST EVAL & MGMT  04/12/2022   UTERINE FIBROID SURGERY  2012   OB History  Gravida Para Term Preterm AB Living  0 0 0 0 0 0  SAB IAB Ectopic Multiple Live Births  0 0 0 0    Patient denies any other pertinent gynecologic issues.  No current facility-administered medications on file prior to encounter.   No current outpatient medications on file prior to encounter.   No Known Allergies  Social History:   reports that she has been smoking cigarettes. She has a 5 pack-year  smoking history. She has never used smokeless tobacco. She reports current alcohol use. She reports that she does not use drugs. History reviewed. No pertinent family history.  Review of Systems: Pertinent items noted in HPI and remainder of comprehensive ROS otherwise negative.  PHYSICAL EXAM: Blood pressure 131/70, pulse 79, temperature 98 F (36.7 C), temperature source Oral, resp. rate 16. General: Appears well, no acute distress. Age appropriate. Cardiac: normal HR noted Respiratory: normal effort Neuro: alert and oriented, no focal deficits Psych: normal affect  OV exam 11/11 CONSTITUTIONAL: alert, oriented, NAD.  SKIN: normal, no rash.  HEENT: normal. HEAD: Normocephalic.  EYES: EOMI.  NECK: thyroid non tender, diffuse and smooth with a nodule on the, right about 1 cm in size.  BREASTS: no palpable masses bilaterally, symmetrical, nipples unremarkable, no skin changes, no axillary adenopathy bilaterally.  LUNGS: clear to auscultation bilaterally, no wheezes, rhonchi, rales.  HEART: no murmurs, regular rate and rhythm.  ABDOMEN: no hepatosplenomegaly, anterior lower abdominal/pelvic mass palpated, mildly tender possibly a large fibroid.  FEMALE GENITOURINARY: labia - unremarkable, vagina - pink moist mucosa, no lesions, thick white discharge, cervix - no discharge or lesions or CMT, adnexa - no masses or tenderness, uterus - nontender and normal size on palpation. Wet prep, GC collected.  EXTREMITIES: normal range of motion.  NEUROLOGIC EXAM: alert and oriented x 3.   Labs:  Results for orders placed or performed during the hospital encounter of 06/07/23 (from the past 336 hour(s))  Type and screen   Collection Time: 06/07/23  2:51 PM  Result Value Ref Range   ABO/RH(D) O POS    Antibody Screen NEG    Sample Expiration 06/10/2023,2359    Unit Number [id number]    Blood Component Type RED CELLS,LR    Unit division 00    Status of Unit ISSUED    Transfusion Status OK TO  TRANSFUSE    Crossmatch Result      Compatible Performed at [facility], [address] [place], [place] [address]    Unit Number [id number]    Blood Component Type RED CELLS,LR    Unit division 00    Status of Unit ALLOCATED    Transfusion Status OK TO TRANSFUSE    Crossmatch Result Compatible   BPAM RBC   Collection Time: 06/07/23  2:51 PM  Result Value Ref Range   ISSUE DATE / TIME [id number]    Blood Product Unit Number [id number]    PRODUCT CODE E0382V00    Unit Type and Rh 5100    Blood Product Expiration Date 664403474259    Blood Product Unit Number [id number]    PRODUCT CODE E0382V00    Unit Type and Rh 5100    Blood Product Expiration Date 188416606301   Vitamin B12   Collection Time: 06/07/23  2:56 PM  Result Value Ref Range   Vitamin B-12 298 180 - 914 pg/mL  Folate   Collection Time: 06/07/23  2:56 PM  Result Value Ref Range   Folate 8.9 >5.9 ng/mL  Iron and TIBC   Collection Time: 06/07/23  2:56 PM  Result Value Ref Range   Iron 13 (L) 28 - 170 ug/dL   TIBC 601 (H) 093 - 235 ug/dL   Saturation Ratios 2 (L) 10.4 - 31.8 %   UIBC 540 ug/dL  Ferritin   Collection Time: 06/07/23  2:56 PM  Result Value Ref Range   Ferritin 2 (L) 11 - 307 ng/mL  Reticulocytes   Collection Time: 06/07/23  2:56 PM  Result Value Ref Range   Retic Ct Pct 1.1 0.4 - 3.1 %   RBC. 2.84 (L) 3.87 - 5.11 MIL/uL   Retic Count, Absolute 32.1 19.0 - 186.0 K/uL   Immature Retic Fract 11.7 2.3 - 15.9 %  CBC   Collection Time: 06/07/23  2:56 PM  Result Value Ref Range   WBC 5.3 4.0 - 10.5 K/uL   RBC 2.89 (L) 3.87 - 5.11 MIL/uL   Hemoglobin 4.1 (LL) 12.0 - 15.0 g/dL   HCT 57.3 (L) 22.0 - 25.4 %   MCV 60.9 (L) 80.0 - 100.0 fL   MCH 14.2 (L) 26.0 - 34.0 pg   MCHC 23.3 (L) 30.0 - 36.0 g/dL   RDW 27.0 (H) 62.3 - 76.2 %   Platelets 236 150 - 400 K/uL   nRBC 0.0 0.0 - 0.2 %  Prepare RBC (crossmatch)   Collection Time: 06/07/23  2:56 PM  Result Value Ref Range   Order  Confirmation      ORDER PROCESSED BY BLOOD BANK Performed at [facility], [address], [place], [place] [address]     Imaging Studies: No results found.  Assessment: Severe anemia 2/2 HMB; symptomatic Iron deficiency anemia Fibroid uterus, hx of prior fibroid surgery 2012; last [place] I reviewed was 2014 with multiple fibroids with the largest fibroid being 2cm.  Thyroid Nodule (Has outpt [place] scheduled for 11/15) Tobacco use disorder  Plan: [facility] to [facility] Type & screen Transfuse 2 units now Posttranfusion H&H. If <7 hgb transfuse an additional unit and obtain posttransfusion H&H Complete pelvic US pending SCDs for DVT ppx Declined nicotine patch Routine care and VS per unit  Caleel Kiner Autry-Lott, DO 06/07/2023, 5:31 PM

## 2023-06-08 ENCOUNTER — Other Ambulatory Visit: Payer: Self-pay

## 2023-06-08 DIAGNOSIS — D259 Leiomyoma of uterus, unspecified: Secondary | ICD-10-CM | POA: Insufficient documentation

## 2023-06-08 DIAGNOSIS — D500 Iron deficiency anemia secondary to blood loss (chronic): Secondary | ICD-10-CM | POA: Diagnosis not present

## 2023-06-08 LAB — HEMOGLOBIN AND HEMATOCRIT, BLOOD
HCT: 23.3 % — ABNORMAL LOW (ref 36.0–46.0)
HCT: 27 % — ABNORMAL LOW (ref 36.0–46.0)
Hemoglobin: 6.4 g/dL — CL (ref 12.0–15.0)
Hemoglobin: 7.6 g/dL — ABNORMAL LOW (ref 12.0–15.0)

## 2023-06-08 LAB — PREPARE RBC (CROSSMATCH)

## 2023-06-08 MED ORDER — ACETAMINOPHEN 325 MG PO TABS: 650.0000 mg | ORAL_TABLET | Freq: Once | ORAL | Status: DC | PRN

## 2023-06-08 MED ORDER — SODIUM CHLORIDE 0.9% IV SOLUTION: Freq: Once | INTRAVENOUS | Status: AC

## 2023-06-08 MED ORDER — DIPHENHYDRAMINE HCL 50 MG/ML IJ SOLN: 25.0000 mg | Freq: Once | INTRAMUSCULAR | Status: DC | PRN

## 2023-06-08 NOTE — Progress Notes (Signed)
RN notified me of Hgb 6.4 s/p 2u PRBCs Dr. Connye Burkitt signed out per Dr. Salvadore Dom to give an additional unti if Hgb returns <7.  RN given verbal order to transfuse an additional unit of PRBCs.  Pt tolerated the first 2u without any  difficulty.

## 2023-06-08 NOTE — Plan of Care (Signed)
  Problem: Education: Goal: Knowledge of General Education information will improve Description: Including pain rating scale, medication(s)/side effects and non-pharmacologic comfort measures Outcome: Adequate for Discharge   Problem: Health Behavior/Discharge Planning: Goal: Ability to manage health-related needs will improve Outcome: Adequate for Discharge   Problem: Clinical Measurements: Goal: Ability to maintain clinical measurements within normal limits will improve Outcome: Adequate for Discharge Goal: Will remain free from infection Outcome: Adequate for Discharge Goal: Diagnostic test results will improve Outcome: Adequate for Discharge Goal: Respiratory complications will improve Outcome: Adequate for Discharge Goal: Cardiovascular complication will be avoided Outcome: Adequate for Discharge   Problem: Activity: Goal: Risk for activity intolerance will decrease Outcome: Adequate for Discharge   Problem: Nutrition: Goal: Adequate nutrition will be maintained Outcome: Adequate for Discharge   Problem: Coping: Goal: Level of anxiety will decrease Outcome: Adequate for Discharge   Problem: Elimination: Goal: Will not experience complications related to bowel motility Outcome: Adequate for Discharge Goal: Will not experience complications related to urinary retention Outcome: Adequate for Discharge   Problem: Pain Management: Goal: General experience of comfort will improve Outcome: Adequate for Discharge   Problem: Safety: Goal: Ability to remain free from injury will improve Outcome: Adequate for Discharge   Problem: Skin Integrity: Goal: Risk for impaired skin integrity will decrease Outcome: Adequate for Discharge   Problem: Education: Goal: Knowledge of disease or condition will improve Outcome: Adequate for Discharge Goal: Knowledge of the prescribed therapeutic regimen will improve Outcome: Adequate for Discharge Goal: Individualized Educational  Video(s) Outcome: Adequate for Discharge   Problem: Clinical Measurements: Goal: Complications related to the disease process, condition or treatment will be avoided or minimized Outcome: Adequate for Discharge

## 2023-06-08 NOTE — Discharge Summary (Addendum)
Physician Discharge Summary  Patient ID: Sally Price MRN: [id number] DOB/AGE: May 24, 1976 47 y.o.  Admit date: 06/07/2023 Discharge date: 06/08/2023  Admission Diagnoses: Severe Anemia  Discharge Diagnoses:  Principal Problem:   Severe anemia Active Problems:   Fibroid uterus   Iron deficiency anemia secondary to blood loss (chronic)   Discharged Condition: stable  Hospital Course: Sally Price is a 47 y.o.  G0P0000 w/ hx of fibroids, hx of fibroid removal 2012, and chronic HMB exacerbated over the last 6 months presented for direct admit and blood transfusion due to hgb 4.3 from OV 06/06/23. Admission hemoglobin was 4.1 and she was transfused initially w/ 2 units pRBCs with a follow up hemoglobin of 6.4 the following morning. She was then transfused with an additional unit with an improved hemoglobin of 7.6. Vital signs remained stable throughout her entire admission. A pelvic US was obtained during an admission and showed an enlarged uterus w/ 3 intramural fibroids measuring 5 cm. Follow up plan discussed with the patient to have a surgical consult in the office to discuss hysterectomy as this is desired by the patient. She was instructed to continue oral iron and consider starting slynd as prescribed prior to admission.   Consults: None  Significant Diagnostic Studies:  Results for orders placed or performed during the hospital encounter of 06/07/23 (from the past 48 hour(s))  Type and screen     Status: None (Preliminary result)   Collection Time: 06/07/23  2:51 PM  Result Value Ref Range   ABO/RH(D) O POS    Antibody Screen NEG    Sample Expiration 06/10/2023,2359    Unit Number [id number]    Blood Component Type RED CELLS,LR    Unit division 00    Status of Unit ISSUED,FINAL    Transfusion Status OK TO TRANSFUSE    Crossmatch Result      Compatible Performed at [facility], [address], [place], [place] [address]    Unit Number [id number]    Blood  Component Type RED CELLS,LR    Unit division 00    Status of Unit ISSUED,FINAL    Transfusion Status OK TO TRANSFUSE    Crossmatch Result Compatible    Unit Number [id number]    Blood Component Type RED CELLS,LR    Unit division 00    Status of Unit ISSUED    Transfusion Status OK TO TRANSFUSE    Crossmatch Result Compatible   Vitamin B12     Status: None   Collection Time: 06/07/23  2:56 PM  Result Value Ref Range   Vitamin B-12 298 180 - 914 pg/mL    Comment: (NOTE) This assay is not validated for testing neonatal or myeloproliferative syndrome specimens for Vitamin B12 levels. Performed at [facility], [address] [place], [place] [address]   Folate     Status: None   Collection Time: 06/07/23  2:56 PM  Result Value Ref Range   Folate 8.9 >5.9 ng/mL    Comment: Performed at [facility], [address], [place], [place] [address]  Iron and TIBC     Status: Abnormal   Collection Time: 06/07/23  2:56 PM  Result Value Ref Range   Iron 13 (L) 28 - 170 ug/dL   TIBC 742 (H) 595 - 638 ug/dL   Saturation Ratios 2 (L) 10.4 - 31.8 %   UIBC 540 ug/dL    Comment: Performed at [facility], [address], [place], [place] [address]  Ferritin     Status: Abnormal   Collection Time: 06/07/23  2:56 PM  Result Value Ref Range   Ferritin 2 (L) 11 - 307 ng/mL    Comment: Performed at [facility], [address], [place], [place] [address]  Reticulocytes     Status: Abnormal   Collection Time: 06/07/23  2:56 PM  Result Value Ref Range   Retic Ct Pct 1.1 0.4 - 3.1 %   RBC. 2.84 (L) 3.87 - 5.11 MIL/uL   Retic Count, Absolute 32.1 19.0 - 186.0 K/uL   Immature Retic Fract 11.7 2.3 - 15.9 %    Comment: Performed at [facility], [address], [place], [place] [address]  Prepare RBC (crossmatch)     Status: None   Collection Time: 06/07/23  2:56 PM  Result Value Ref Range   Order Confirmation      ORDER PROCESSED BY BLOOD BANK Performed at [facility], [address], [place], [place] [address]   CBC     Status: Abnormal   Collection Time: 06/07/23  2:56 PM  Result Value Ref Range   WBC 5.3 4.0 - 10.5 K/uL   RBC 2.89 (L) 3.87 - 5.11 MIL/uL   Hemoglobin 4.1 (LL) 12.0 - 15.0 g/dL    Comment: REPEATED TO VERIFY Reticulocyte Hemoglobin testing may be clinically indicated, consider ordering this additional test [id number] THIS CRITICAL RESULT HAS VERIFIED AND BEEN CALLED TO H.PRICE RN BY ELHAM AHMED ON 11 12 2024 AT 1535, AND HAS BEEN READ BACK.     HCT 17.6 (L) 36.0 - 46.0 %   MCV 60.9 (L) 80.0 - 100.0 fL   MCH 14.2 (L) 26.0 - 34.0 pg   MCHC 23.3 (L) 30.0 - 36.0 g/dL   RDW 72.5 (H) 36.6 - 44.0 %   Platelets 236 150 - 400 K/uL    Comment: REPEATED TO VERIFY   nRBC 0.0 0.0 - 0.2 %    Comment: Performed at [facility], [address] [place], [place] [address]  Hemoglobin and hematocrit, blood     Status: Abnormal   Collection Time: 06/08/23  5:02 AM  Result Value Ref Range   Hemoglobin 6.4 (LL) 12.0 - 15.0 g/dL    Comment: REPEATED TO VERIFY THIS CRITICAL RESULT HAS VERIFIED AND BEEN CALLED TO CAROLINE INGOLD RN BY ROIDER SATRAIN ON 11 13 2024 AT 0604, AND HAS BEEN READ BACK.     HCT 23.3 (L) 36.0 - 46.0 %    Comment: Performed at [facility], [address], [place], [place] [address]  Prepare RBC (crossmatch)     Status: None   Collection Time: 06/08/23  6:10 AM  Result Value Ref Range   Order Confirmation      ORDER PROCESSED BY BLOOD BANK Performed at [facility], [address], [place], [place] [address]   Hemoglobin and hematocrit, blood     Status: Abnormal   Collection Time: 06/08/23  9:59 AM  Result Value Ref Range   Hemoglobin 7.6 (L) 12.0 - 15.0 g/dL   HCT 33.2 (L) 95.1 - 88.4 %    Comment: Performed at [facility], [address], [place], [place] [address]     Treatments: Blood transfusion 3 units pRBCs  Discharge Exam: Blood pressure 138/62, pulse 67, temperature  98 F (36.7 C), temperature source Oral, resp. rate 17, SpO2 100%. General: Appears well, no acute distress. Age appropriate. Cardiac: normal  heart rate noted Respiratory: normal effort Extremities: No edema or cyanosis. Skin: Warm and dry, no rashes noted Neuro: alert and oriented, no focal deficits Psych: normal affect   Disposition: Discharge disposition: 01-Home or Self Care        Allergies as of 06/08/2023   No Known Allergies      Medication List    You have not been prescribed any medications.      SignedRanda Evens Autry-Lott 06/08/2023, 11:20 PM

## 2023-06-08 NOTE — Discharge Instructions (Signed)
-  Keep ultrasound appt for your thyroid -Continue oral iron supplement  -We will call you to discuss follow up

## 2023-06-09 LAB — TYPE AND SCREEN
ABO/RH(D): O POS
Antibody Screen: NEGATIVE
Unit division: 0
Unit division: 0
Unit division: 0

## 2023-06-09 LAB — BPAM RBC
Blood Product Expiration Date: 202412062359
Blood Product Expiration Date: 202412062359
Blood Product Expiration Date: 202412072359
ISSUE DATE / TIME: 202411121557
ISSUE DATE / TIME: 202411122125
ISSUE DATE / TIME: 202411130633
Unit Type and Rh: 5100
Unit Type and Rh: 5100
Unit Type and Rh: 5100

## 2023-06-10 ENCOUNTER — Other Ambulatory Visit: Payer: No Typology Code available for payment source

## 2023-06-10 ENCOUNTER — Ambulatory Visit
Admission: RE | Admit: 2023-06-10 | Discharge: 2023-06-10 | Disposition: A | Discharge status: Home / Self Care | Payer: No Typology Code available for payment source | Source: Ambulatory Visit | Attending: Family Medicine | Admitting: Family Medicine

## 2023-06-10 DIAGNOSIS — E079 Disorder of thyroid, unspecified: Secondary | ICD-10-CM

## 2023-08-25 ENCOUNTER — Other Ambulatory Visit: Payer: Self-pay | Admitting: Obstetrics and Gynecology

## 2023-08-25 DIAGNOSIS — D500 Iron deficiency anemia secondary to blood loss (chronic): Secondary | ICD-10-CM

## 2023-08-26 ENCOUNTER — Non-Acute Institutional Stay (HOSPITAL_COMMUNITY)
Admission: RE | Admit: 2023-08-26 | Discharge: 2023-08-26 | Disposition: A | Discharge status: Home / Self Care | Payer: No Typology Code available for payment source | Source: Ambulatory Visit | Attending: Internal Medicine | Admitting: Internal Medicine

## 2023-08-26 VITALS — BP 122/62 | HR 79 | Temp 97.9°F | Resp 16

## 2023-08-26 DIAGNOSIS — D500 Iron deficiency anemia secondary to blood loss (chronic): Secondary | ICD-10-CM | POA: Diagnosis present

## 2023-08-26 LAB — HEMOGLOBIN AND HEMATOCRIT, BLOOD
HCT: 20.6 % — ABNORMAL LOW (ref 36.0–46.0)
HCT: 26.9 % — ABNORMAL LOW (ref 36.0–46.0)
Hemoglobin: 5.1 g/dL — CL (ref 12.0–15.0)
Hemoglobin: 7.6 g/dL — ABNORMAL LOW (ref 12.0–15.0)

## 2023-08-26 LAB — PREPARE RBC (CROSSMATCH)

## 2023-08-26 MED ORDER — FUROSEMIDE 10 MG/ML IJ SOLN
20.0000 mg | Freq: Once | INTRAMUSCULAR | Status: AC
  Administered 2023-08-26: 20 mg via INTRAVENOUS
  Filled 2023-08-26: qty 2

## 2023-08-26 MED ORDER — SODIUM CHLORIDE 0.9% IV SOLUTION: Freq: Once | INTRAVENOUS | Status: AC

## 2023-08-26 MED ORDER — ACETAMINOPHEN 325 MG PO TABS
650.0000 mg | ORAL_TABLET | Freq: Once | ORAL | Status: AC
  Administered 2023-08-26: 650 mg via ORAL
  Filled 2023-08-26: qty 2

## 2023-08-26 MED ORDER — DIPHENHYDRAMINE HCL 25 MG PO CAPS
25.0000 mg | ORAL_CAPSULE | Freq: Once | ORAL | Status: AC
  Administered 2023-08-26: 25 mg via ORAL
  Filled 2023-08-26: qty 1

## 2023-08-26 NOTE — Progress Notes (Signed)
PATIENT CARE CENTER NOTE   Diagnosis: Iron deficiency anemia due to chronic blood loss   Provider: Gerald Leitz, MD   Procedure: Blood transfusion     Note: Patient received transfusion of 2 unit PRBC's via PIV. H&H drawn prior to transfusion and Hemoglobin 5.1. Patient pre-mediated with PO Tylenol and Benadryl per order. Patient tolerated transfusions well with no adverse reaction. Vital signs remained stable. Lasix 20 mg IV push given post infusion per order. Patient waited 1 hour post infusion and post H&H drawn per order. Discharge instructions given. Patient alert, oriented and ambulatory at discharge.

## 2023-08-29 LAB — BPAM RBC
Blood Product Expiration Date: 202503022359
Blood Product Expiration Date: 202503032359
ISSUE DATE / TIME: 202501310949
ISSUE DATE / TIME: 202501310949
Unit Type and Rh: 5100
Unit Type and Rh: 5100

## 2023-08-29 LAB — TYPE AND SCREEN
ABO/RH(D): O POS
Antibody Screen: NEGATIVE
Unit division: 0
Unit division: 0

## 2023-09-01 ENCOUNTER — Encounter (HOSPITAL_BASED_OUTPATIENT_CLINIC_OR_DEPARTMENT_OTHER): Payer: Self-pay | Admitting: Obstetrics and Gynecology

## 2023-09-01 NOTE — Progress Notes (Signed)
 Your procedure is scheduled on  :  Wednesday,  09-07-2023  Report to [facility] AT  __11:45 _ AM.   Call this number if you have problems the morning of surgery: [phone] Any questions prior to surgery call pre-op nurse,  Jayel Scaduto :  [phone]   OUR ADDRESS IS [address]  WE ARE LOCATED IN THE [facility]  PLEASE BRING YOUR INSURANCE CARD AND PHOTO ID DAY OF SURGERY.                                     REMEMBER: Do not eat food after midnight night before surgery.  You may have clear liquid diet from midnight night before surgery until 10:45 AM.  NO clear liquids after 10:45 AM  day of surgery.  This includes no water ,  candy,  gum,  and  mints.    Please brush your teeth morning of surgery and rinse mouth out.    CLEAR LIQUID DIET Allowed      Water                                                                    Coffee and tea, regular and decaf  (NO cream or milk products of any type, may sweeten,  no honey)                         Carbonated beverages, regular and diet                                    Sports drinks like Gatorade _____________________________________________________________________     TAKE ONLY THESE MEDICATIONS MORNING OF SURGERY:   None                                        DO NOT WEAR JEWERLY/  METAL/  PIERCINGS (INCLUDING NO PLASTIC PIERCINGS) DO NOT WEAR LOTIONS, POWDERS, PERFUMES OR NAIL POLISH ON YOUR FINGERNAILS. TOENAIL POLISH IS OK TO WEAR. DO NOT SHAVE FOR 48 HOURS PRIOR TO DAY OF SURGERY.  CONTACTS, GLASSES, OR DENTURES MAY NOT BE WORN TO SURGERY.  REMEMBER: NO SMOKING, VAPING ,  DRUGS OR ALCOHOL FOR 24 HOURS BEFORE YOUR SURGERY.                                    [facility] IS NOT RESPONSIBLE  FOR ANY BELONGINGS.                                                                    [facility]           [facility] - Preparing for  Surgery Before surgery, you can play an important role.   Because skin is not sterile, your skin needs to be as free of germs as possible.  You can reduce the number of germs on your skin by washing with CHG (chlorahexidine gluconate) soap before surgery.  CHG is an antiseptic cleaner which kills germs and bonds with the skin to continue killing germs even after washing. Please DO NOT use if you have an allergy to CHG or antibacterial soaps.  If your skin becomes reddened/irritated stop using the CHG and inform your nurse when you arrive at Short Stay. Do not shave (including legs and underarms) for at least 48 hours prior to the first CHG shower.  You may shave your face/neck. Please follow these instructions carefully:  1.  Shower with CHG Soap the night before surgery and the  morning of Surgery.  2.  If you choose to wash your hair, wash your hair first as usual with your  normal  shampoo.  3.  After you shampoo, rinse your hair and body thoroughly to remove the  shampoo.                                        4.  Use CHG as you would any other liquid soap.  You can apply chg directly  to the skin and wash , chg soap provided, night before and morning of your surgery.  5.  Apply the CHG Soap to your body ONLY FROM THE NECK DOWN.   Do not use on face/ open                           Wound or open sores. Avoid contact with eyes, ears mouth and genitals (private parts).                       Wash face,  Genitals (private parts) with your normal soap.             6.  Wash thoroughly, paying special attention to the area where your surgery  will be performed.  7.  Thoroughly rinse your body with warm water  from the neck down.  8.  DO NOT shower/wash with your normal soap after using and rinsing off  the CHG Soap.             9.  Pat yourself dry with a clean towel.            10.  Wear clean pajamas.            11.  Place clean sheets on your bed the night of your first shower and do not  sleep with pets. Day of Surgery : Do not apply any lotions/ powders  the morning of surgery.  Please wear clean clothes to the hospital/surgery center.  IF YOU HAVE ANY SKIN IRRITATION OR PROBLEMS WITH THE SURGICAL SOAP, PLEASE GET A BAR OF GOLD DIAL SOAP AND SHOWER THE NIGHT BEFORE YOUR SURGERY AND THE MORNING OF YOUR SURGERY. PLEASE LET THE NURSE KNOW MORNING OF YOUR SURGERY IF YOU HAD ANY PROBLEMS WITH THE SURGICAL SOAP.   YOUR SURGEON MAY HAVE REQUESTED EXTENDED RECOVERY TIME AFTER YOUR SURGERY. IT COULD BE A  JUST A FEW HOURS  UP TO AN OVERNIGHT STAY.  YOUR SURGEON SHOULD HAVE DISCUSSED THIS WITH YOU PRIOR  TO YOUR SURGERY. IN THE EVENT YOU NEED TO STAY OVERNIGHT PLEASE REFER TO THE FOLLOWING GUIDELINES. YOU MAY HAVE UP TO 4 VISITORS  MAY VISIT IN THE EXTENDED RECOVERY ROOM UNTIL 800 PM ONLY.  ONE  VISITOR AGE 48 AND OVER MAY SPEND THE NIGHT AND MUST BE IN EXTENDED RECOVERY ROOM NO LATER THAN 800 PM . YOUR DISCHARGE TIME AFTER YOU SPEND THE NIGHT IS 900 AM THE MORNING AFTER YOUR SURGERY. YOU MAY PACK A SMALL OVERNIGHT BAG WITH TOILETRIES FOR YOUR OVERNIGHT STAY IF YOU WISH.  REGARDLESS OF IF YOU STAY OVER NIGHT OR ARE DISCHARGED THE SAME DAY YOU WILL BE REQUIRED TO HAVE A RESPONSIBLE ADULT (18 YRS OLD OR OLDER) STAY WITH YOU FOR AT LEAST THE FIRST 73 HOURS WHEN HOME.  YOUR PRESCRIPTION MEDICATIONS WILL BE PROVIDED DURING [facility] STAY.  ________________________________________________________________________

## 2023-09-01 NOTE — Progress Notes (Addendum)
 Addendum:  pt had lab draw today at PAT lab appointment.  Abnormal CBC lab result routed to Dr Rosalva in epic.  Will review with anesthesia.  Addendum:   Pt will need T&S day of surgery,  since he recently had blood transfusion could not be drawn prior to.  Spoke w/ via phone for pre-op interview--- pt Lab needs dos----  urine preg       Lab results------ lab appt 09-06-2023 @ 0900 getting CBC/ T&S COVID test -----patient states asymptomatic no test needed Arrive at ------- 1145 on 09-07-2023  NPO after MN NO Solid Food.  Clear liquids from MN until--- 1045 Med rec completed Medications to take morning of surgery ----- none Diabetic medication ----- n/a Patient instructed no nail polish to be worn day of surgery Patient instructed to bring photo id and insurance card day of surgery Patient aware to have Driver (ride ) / caregiver    for 24 hours after surgery - sister , brittany Patient Special Instructions ----- will pick up bag w/ hibiclens  and written instructions at lab appt. Asked to call w/ any questions Pre-Op special Instructions ----- sent inbox message to dr rosalva in epic on 08-31-2023,  requested pre-op orders Patient verbalized understanding of instructions that were given at this phone interview. Patient denies chest pain, sob, fever, cough at the interview.

## 2023-09-05 ENCOUNTER — Other Ambulatory Visit: Payer: Self-pay | Admitting: Obstetrics and Gynecology

## 2023-09-05 DIAGNOSIS — D219 Benign neoplasm of connective and other soft tissue, unspecified: Secondary | ICD-10-CM

## 2023-09-05 NOTE — H&P (Signed)
 Subjective:    Chief Complaint(s): Preop vt/ Fibroids / Heavy Menses/Painful menses / Anemia    HPI:      General 48 y/o presents for pre-op visit. Pt is schedule for a robotic assisted laparoscopic hysterectomy with bilateral salpingectomy on 09/07/2023 for the management of menorrhagia and fibroids.  IN REVIEW: She had an ultrasound performed 06/07/2023 that revealed a uterus measuring. 12.6 cm x 8.2 cmx 11.1 cm. 3 fiborids were noted the largest was 5.5 cm. Both ovaries were normal. Her endometrium was 16 mm. she has iron deficiency anemia due to chronic blood lose from menses. She has history of blood transfusion in 2020 and in 02/2023.  She is currently on Slyndbut it has not helped with heavy menses.    Current Medication:     Taking Iron.     Not-Taking Clindamycin HCl 150 MG Capsule 2 capsules Orally 4 times daily. Oriahnn(Elagolix-Estrad-Noreth & Elago) 300-1-0.5 & 300 MG Capsule Therapy Pack as directed Orally two times a day. Medication List reviewed and reconciled with the patient.    Medical History: blood transfusion seasonal allergies    Allergies/Intolerance:      N.K.D.A.    Gyn History: Sexual activity currently sexually active.  Periods : regular.  LMP 08/18/2023.  Denies Birth control.  Last pap smear date 12/26/12, ASCUS.  Denies Last mammogram date 2024 normal per pt.  H/O Abnormal pap smear assessed with colposcopy, yes.  STD Gential warts.     OB History: Never been pregnant  per patient.     Surgical History: [facility] 01/2007 hysteroscopic Removal of Fibroids 04/2011    Hospitalization: No Hospitalization History.    Family History: Father: deceased, Heart Attack at 71, DM, HTN, diagnosed with Hypertension, Diabetes Mother: alive, HTN, DM, diagnosed with Diabetes, Hypertension Paternal Grand Father: deceased Paternal Grand Mother: deceased Maternal Grand Father: deceased Maternal Grand Mother: deceased Sister 1: alive Sister 2:  alive denies any GYN family cancer hx.    Social History:      General Tobacco use:   cigarettes:  Current smoker, Frequency:  intermittent smoker, Tobacco history last updated  08/24/2023, Vaping  No.  EXPOSURE TO PASSIVE SMOKE:  yes.   Alcohol:  yes social. Caffeine:  yes 1 servings daily , coffee. Recreational drug use:  no.   Marital Status:  single.   Children:  none.   OCCUPATION:  [employer].    ROS:      CONSTITUTIONAL Chills  No.  Fatigue  No.  Fever  No.  Night sweats  No.  Recent travel outside [place]   No.  Sweats  No.  Weight change  No.       OPHTHALMOLOGY Blurring of vision  no.  Change in vision  no.  Double vision  no.       ENT Dizziness  no.  Nose bleeds  no.  Sore throat  no.  Teeth pain  no.       ALLERGY Hives  no.       CARDIOLOGY Chest pain  no.  High blood pressure  no.  Irregular heart beat  no.  Leg edema  no.  Palpitations  no.       RESPIRATORY Shortness of breath  no.  Cough  no.  Wheezing  no.       UROLOGY Pain with urination  no.  Urinary urgency  no.  Urinary frequency  no.  Urinary incontinence  no.  Difficulty urinating  No.  Blood  in urine  No.       GASTROENTEROLOGY Abdominal pain  no.  Appetite change  no.  Bloating/belching  no.  Blood in stool or on toilet paper  no.  Change in bowel movements  no.  Constipation  no.  Diarrhea  no.  Difficulty swallowing  no.  Nausea  no.       FEMALE REPRODUCTIVE Vulvar pain  no.  Vulvar rash  no.  Abnormal vaginal bleeding  , heavy menses.  Breast pain  no.  Nipple discharge  no.  Pain with intercourse  no.  Pelvic pain  no.  Unusual vaginal discharge  no.  Vaginal itching  no.       MUSCULOSKELETAL Muscle aches  no.       NEUROLOGY Headache  no.  Tingling/numbness  no.  Weakness  no.       PSYCHOLOGY Depression  no.  Anxiety  no.  Nervousness  no.  Sleep disturbances  no.  Suicidal ideation  no .       ENDOCRINOLOGY Excessive thirst  no.  Excessive urination  no.   Hair loss  no.  Heat or cold intolerance  no.       HEMATOLOGY/LYMPH Abnormal bleeding  no.  Easy bruising  no.  Swollen glands  no.       DERMATOLOGY New/changing skin lesion  no.  Rash  no.  Sores  no.  Negative except as stated in HPI.   Objective:    Vitals: Wt: 175.6, Wt change: -2 lbs, Ht: 64, BMI: 30.14, Pulse sitting: 97, BP sitting: 110/60.    Past Results:    Examination:      General Examination CONSTITUTIONAL: alert, oriented, NAD.  SKIN:  moist, warm.  EYES:  Conjunctiva clear.  LUNGS: good I:E efffort noted, clear to auscultation bilaterally.  HEART: regular rate and rhythm.  ABDOMEN: soft, non-tender/non-distended, bowel sounds present.  FEMALE GENITOURINARY: normal external genitalia, labia - unremarkable, vagina - pink moist mucosa, no lesions or abnormal discharge, cervix - no discharge or lesions or CMT, adnexa - no masses or tenderness, uterus - nontender and 14 week size on palpation.  EXTREMITIES: no edema present.  PSYCH:  affect normal, good eye contact.     Physical Examination:      Chaperone present Chaperone present Plata, Menda 08/24/2023 09:00:19 AM >, for pelvic exam.    Assessment:    Assessment: Menorrhagia with regular cycle - N92.0 (Primary)    Fibroid - D25.9    Dysmenorrhea - N94.6    Chronic blood loss anemia - D50.0      Plan:    Treatment:     Menorrhagia with regular cycle  Lab:HGB (Collection Date & Time - 08/24/2023 09:07 AM)      Notes: Planning robotic-assisted laparoscopic hysterectomy with bilateral salpingectomy. Pt advised she may go home sameday or stay overnight, or 2 days if conversion to larger incision. She is advised that in order to be discharged from hospital, she will need to be able to ambulate, urinate, tolerate food by mouth, and pain must be controlled with medication by mouth. Discussed risks of hysterectomy including but not limited to infection, bleeding, conversion to larger incision, damage to her bowel,  bladder, or ureters, with the need for further surgery. Discussed risk of blood transfusion and risk of HIV or hep B&C with blood transfusion. Pt is aware of risks and desires blood transfusion if needed. Pt advised to avoid NSAIDs (Aspirin, Aleve, Advil , Ibuprofen , Motrin ) from now  until surgery given risk of bleeding during surgery. She may take Tylenol  for pain management. She is advised to avoid eating or drinking starting midnight prior to surgery. Discussed post-surgery avoidance of driving for 1 week and avoidance of lifting weight greater than 10 lbs or intercourse for 6-8 weeks after procedure. Follow up in 4 weeks for 2 wk post-op visit.     Fibroid     Notes: Planning robotic-assisted laparoscopic hysterectomy with bilateral salpingectomy. Pt advised she may go home sameday or stay overnight, or 2 days if conversion to larger incision. She is advised that in order to be discharged from hospital, she will need to be able to ambulate, urinate, tolerate food by mouth, and pain must be controlled with medication by mouth. Discussed risks of hysterectomy including but not limited to infection, bleeding, conversion to larger incision, damage to her bowel, bladder, or ureters, with the need for further surgery. Discussed risk of blood transfusion and risk of HIV or hep B&C with blood transfusion. Pt is aware of risks and desires blood transfusion if needed. Pt advised to avoid NSAIDs (Aspirin, Aleve, Advil , Ibuprofen , Motrin ) from now until surgery given risk of bleeding during surgery. She may take Tylenol  for pain management. She is advised to avoid eating or drinking starting midnight prior to surgery. Discussed post-surgery avoidance of driving for 1 week and avoidance of lifting weight greater than 10 lbs or intercourse for 6-8 weeks after procedure. Follow up in 4 weeks for 2 wk post-op visit.     Dysmenorrhea Start Ibuprofen  Tablet, 800 MG, 1 tablet with food or milk as needed, Orally, every 8 hrs as  needed, 3 days, 10 Tablet, Refills 0     Notes: Planning robotic-assisted laparoscopic hysterectomy with bilateral salpingectomy. Pt advised she may go home sameday or stay overnight, or 2 days if conversion to larger incision. She is advised that in order to be discharged from hospital, she will need to be able to ambulate, urinate, tolerate food by mouth, and pain must be controlled with medication by mouth. Discussed risks of hysterectomy including but not limited to infection, bleeding, conversion to larger incision, damage to her bowel, bladder, or ureters, with the need for further surgery. Discussed risk of blood transfusion and risk of HIV or hep B&C with blood transfusion. Pt is aware of risks and desires blood transfusion if needed. Pt advised to avoid NSAIDs (Aspirin, Aleve, Advil , Ibuprofen , Motrin ) from now until surgery given risk of bleeding during surgery. She may take Tylenol  for pain management. She is advised to avoid eating or drinking starting midnight prior to surgery. Discussed post-surgery avoidance of driving for 1 week and avoidance of lifting weight greater than 10 lbs or intercourse for 6-8 weeks after procedure. Follow up in 4 weeks for 2 wk post-op visit.     Chronic blood loss anemia     Notes: pt advised of critical hgb value.. she is scheduled for transfusion of 2 units pRBC at the [place] [facility]. r/b/a of transfusion were reviewed including but not limited to transfusion reaction r/o HIV Hep B&C discussed . she voiced understanding and is agreeable to transfusion.    Procedures:      Venipuncture Venipuncture: Celesta Coke 08/24/2023 09:09:39 AM >, performed in right arm.     Immunizations:    Therapeutic Injections:    Diagnostic Imaging:    Lab Reports:  Lab:HGB (Collection Date & Time - 08/24/2023 09:07 AM)  Critical low  Value Reference Range                  HGB - 5.9 LL 12.0-16.0 - g/dL   Notes: Arlee Lace 04/54/0981  03:31:44 PM > call placed to patient. she is informed hemogloben is very low.. you will need a blood tranfusion prior to your surgery. she request to delay transfusion until tomorrow. she is advised to go to the emergency room if she feels faint or sob between now and when she is admitted for transfusion

## 2023-09-06 ENCOUNTER — Encounter (HOSPITAL_COMMUNITY)
Admission: RE | Admit: 2023-09-06 | Discharge: 2023-09-06 | Disposition: A | Discharge status: Home / Self Care | Payer: No Typology Code available for payment source | Source: Ambulatory Visit | Attending: Obstetrics and Gynecology

## 2023-09-06 ENCOUNTER — Encounter (HOSPITAL_BASED_OUTPATIENT_CLINIC_OR_DEPARTMENT_OTHER): Payer: Self-pay | Admitting: Anesthesiology

## 2023-09-06 DIAGNOSIS — D219 Benign neoplasm of connective and other soft tissue, unspecified: Secondary | ICD-10-CM | POA: Diagnosis not present

## 2023-09-06 DIAGNOSIS — Z01812 Encounter for preprocedural laboratory examination: Secondary | ICD-10-CM | POA: Insufficient documentation

## 2023-09-06 DIAGNOSIS — Z01818 Encounter for other preprocedural examination: Secondary | ICD-10-CM | POA: Diagnosis present

## 2023-09-06 LAB — CBC
HCT: 28.4 % — ABNORMAL LOW (ref 36.0–46.0)
Hemoglobin: 7.5 g/dL — ABNORMAL LOW (ref 12.0–15.0)
MCH: 18.5 pg — ABNORMAL LOW (ref 26.0–34.0)
MCHC: 26.4 g/dL — ABNORMAL LOW (ref 30.0–36.0)
MCV: 70 fL — ABNORMAL LOW (ref 80.0–100.0)
Platelets: 235 10*3/uL (ref 150–400)
RBC: 4.06 MIL/uL (ref 3.87–5.11)
RDW: 28 % — ABNORMAL HIGH (ref 11.5–15.5)
WBC: 8.2 10*3/uL (ref 4.0–10.5)
nRBC: 0 % (ref 0.0–0.2)

## 2023-09-06 NOTE — Progress Notes (Signed)
Chart reviewed w/ anesthesia , Dr Richardson Landry MDA, due to patient history severe anemia and with abnormal CBC result done today 09-06-2023 and patient is scheduled for robotic-assisted hysterectomy tomorrow 09-07-2023 by Dr Richardson Dopp @ [facility].  Dr Richardson Landry MDA stated patient will need to moved to main OR .   Called and spoke w/ [place], [place] scheduler for Dr Richardson Dopp, informed her of anesthesia recommendation to move patient to main OR and why.  Stated she will inform Dr Richardson Dopp.

## 2023-09-07 ENCOUNTER — Encounter (HOSPITAL_BASED_OUTPATIENT_CLINIC_OR_DEPARTMENT_OTHER): Admission: RE | Payer: Self-pay | Source: Home / Self Care

## 2023-09-07 ENCOUNTER — Ambulatory Visit (HOSPITAL_BASED_OUTPATIENT_CLINIC_OR_DEPARTMENT_OTHER)
Admission: RE | Admit: 2023-09-07 | Payer: No Typology Code available for payment source | Source: Home / Self Care | Admitting: Obstetrics and Gynecology

## 2023-09-07 DIAGNOSIS — Z01818 Encounter for other preprocedural examination: Secondary | ICD-10-CM

## 2023-09-07 HISTORY — DX: Iron deficiency anemia secondary to blood loss (chronic): D50.0

## 2023-09-07 HISTORY — DX: Leiomyoma of uterus, unspecified: D25.9

## 2023-09-07 HISTORY — DX: Dermatitis, unspecified: L30.9

## 2023-09-07 HISTORY — DX: Dysmenorrhea, unspecified: N94.6

## 2023-09-07 HISTORY — DX: Gastro-esophageal reflux disease without esophagitis: K21.9

## 2023-09-07 HISTORY — DX: Anemia, unspecified: D64.9

## 2023-09-07 HISTORY — DX: Excessive and frequent menstruation with regular cycle: N92.0

## 2023-09-07 SURGERY — XI ROBOTIC ASSISTED LAPAROSCOPIC HYSTERECTOMY AND SALPINGECTOMY
Anesthesia: Choice | Laterality: Bilateral

## 2023-09-15 ENCOUNTER — Encounter (HOSPITAL_COMMUNITY): Payer: Self-pay | Admitting: Obstetrics and Gynecology

## 2023-09-15 MED ORDER — LACTATED RINGERS IV SOLN: INTRAVENOUS | Status: AC

## 2023-09-15 NOTE — Progress Notes (Addendum)
 Addendum:   on 09-20-2023  Pt came for PAT lab appointment had CBC done,  abnormal results routed to Dr Karie Schwalbe. Richardson Dopp in epic.  Called Dr Richardson Dopp via phone,  Dr Richardson Dopp stated she will call [facility] and see about getting patient set up for blood transfusion tomorrow since patient is first case on Thursday 09-22-2023.  Anesthesia will be notified.  Called and spoke w/ Dr Maurilio Lovely via [facility] phone informed patient lab w/ Hg 7.6 result today and was previous patient that rescheduled from [facility] 3 weeks due to Hg 7.6 moved to main OR by Dr Richardson Landry MDA at that time.  Informed Dr Isaias Cowman I have spoke to Dr Richardson Dopp personally via phone and Dr Richardson Dopp stated she going to have patient set up for blood transfusion tomorrow.  Dr Isaias Cowman stated okay.  Addendum:   Pt will need T&S day of surgery.  Released by error today. Please re-enter. Unsure why but Dr Dawayne Patricia pre-order's are not in epic but they were the day I spoke to patient.  Sent high priority inbox  message to Dr Richardson Dopp in epic .   Spoke w/ via phone for pre-op interview--- pt Lab needs dos----   urine preg      Lab results------ lab appt 09-20-2023 @ 0900 getting CBC/ T&S COVID test -----patient states asymptomatic no test needed Arrive at ------- 0530  NPO after MN  Med rec completed Medications to take morning of surgery ----- none Diabetic medication ----- n/a Patient instructed no nail polish to be worn day of surgery Patient instructed to bring photo id and insurance card day of surgery Patient aware to have Driver (ride ) / caregiver    for 24 hours after surgery - sister, brittany Patient Special Instructions ----- will pick up bag w/ CHG and written instructions at lab appt Pre-Op special Instructions ----- n/a Patient verbalized understanding of instructions that were given at this phone interview. Patient denies chest pain, sob, fever, cough at the interview.     Anesthesia Review:   Hx Severe anemia ;  pt was scheduled at [facility] 09-07-2023 had lab  result 09-06-2023 Hg 7.5, per Dr Richardson Landry MDA , had pt moved to main OR.  Previously pt was sent to [facility] for Hg 5.1 on 08-26-2023 transfused 2 units PRBCs after transfusion Hg 7.6. Noted in office pre-op visit patient Hg 8.0 on 09-13-2023.  Pt has PAT lab visit scheduled 09-20-2023.   PCP: Dr Kathie Dike Chest x-ray : 12-06-2021 EKG : 12-06-2021 Echo :  no Activity level:  denies sob w/ any activity Sleep Study/ CPAP : no

## 2023-09-15 NOTE — Pre-Procedure Instructions (Signed)
Surgical Instructions   Your procedure is scheduled on :  Thursday,  09-22-2023. Report to [facility] Main Entrance "A" at 5:30 A.M., then check in with the Admitting office. Any questions or running late day of surgery: call [phone]  Questions prior to your surgery date: call [phone] or call [phone], Monday-Friday, 8am-4pm. If you experience any cold or flu symptoms such as cough, fever, chills, shortness of breath, etc. between now and your scheduled surgery, please notify your surgeon office.    Remember:  Do not eat any food and do drink any liquid after midnight the night before your surgery.  This includes no candy,  gum,  and  mints.     Take these medicines the morning of surgery with A SIP OF WATER :  None   May take these medicines IF NEEDED: none    One week prior to surgery, STOP taking any Aspirin (unless otherwise instructed by your surgeon) Aleve, Naproxen, Ibuprofen, Motrin, Advil, Goody's, BC's, all herbal medications, fish oil, and non-prescription vitamins.                     Do NOT Smoke (Tobacco/Vaping) for 24 hours prior to your procedure.  If you use a CPAP at night, you may bring your mask/headgear for your overnight stay.   You will be asked to remove any contacts, glasses, piercing's, hearing aid's, dentures/partials prior to surgery. Please bring cases for these items if needed.    Patients discharged the day of surgery will not be allowed to drive home, and someone needs to stay with them for 24 hours.  SURGICAL WAITING ROOM VISITATION Patients may have no more than 2 support people in the waiting area - these visitors may rotate.   Pre-op nurse will coordinate an appropriate time for 1 ADULT support person, who may not rotate, to accompany patient in pre-op.  Children under the age of 32 must have an adult with them who is not the patient and must remain in the main waiting area with an adult.  If the patient needs to stay at the  hospital during part of their recovery, the visitor guidelines for inpatient rooms apply.  Please refer to the [facility] website for the visitor guidelines for any additional information.   If you received a COVID test during your pre-op visit  it is requested that you wear a mask when out in public, stay away from anyone that may not be feeling well and notify your surgeon if you develop symptoms. If you have been in contact with anyone that has tested positive in the last 10 days please notify you surgeon.      Pre-operative CHG Bathing Instructions   You can play a key role in reducing the risk of infection after surgery. Your skin needs to be as free of germs as possible. You can reduce the number of germs on your skin by washing with CHG (chlorhexidine gluconate) soap before surgery. CHG is an antiseptic soap that kills germs and continues to kill germs even after washing.   DO NOT use if you have an allergy to chlorhexidine/CHG or antibacterial soaps. If your skin becomes reddened or irritated, stop using the CHG and notify Pre-Op nurse day of surgery.  If you have any skin irritation or problems with the surgical soap (CHG), do not use.  Please get a bar of gold dial soap and follow instructions below about showering using the dial soap.  TAKE A SHOWER THE NIGHT BEFORE SURGERY AND THE DAY OF SURGERY    Please keep in mind the following:  DO NOT shave, including legs and underarms, 48 hours prior to surgery.   You may shave your face before/day of surgery.  Place clean sheets on your bed the night before surgery Use a clean washcloth (not used since being washed) for each shower. DO NOT sleep with pet's night before surgery.  CHG Shower Instructions:  Wash your face and private area with normal soap. If you choose to wash your hair, wash first with your normal shampoo.  After you use shampoo/soap, rinse your hair and body thoroughly to remove shampoo/soap residue.   Turn the water OFF and apply half the bottle of CHG soap to a CLEAN washcloth.  Apply CHG soap ONLY FROM YOUR NECK DOWN TO YOUR TOES (washing for 3-5 minutes)  DO NOT use CHG soap on face, private areas, open wounds, or sores.  Pay special attention to the area where your surgery is being performed.  If you are having back surgery, having someone wash your back for you may be helpful. Wait 2 minutes after CHG soap is applied, then you may rinse off the CHG soap.  Pat dry with a clean towel  Put on clean pajamas    Additional instructions for the day of surgery: DO NOT APPLY any lotions, deodorants, cologne, or perfumes.   Do not wear jewelry or makeup Do not wear nail polish, gel polish, artificial nails, or any other type of covering on natural nails (fingers and toes) Do not bring valuables to the hospital. [facility] is not responsible for valuables/personal belongings. Put on clean/comfortable clothes.  Please brush your teeth.  Ask your nurse before applying any prescription medications to the skin.

## 2023-09-20 ENCOUNTER — Encounter (HOSPITAL_COMMUNITY)
Admission: RE | Admit: 2023-09-20 | Discharge: 2023-09-20 | Disposition: A | Discharge status: Home / Self Care | Payer: No Typology Code available for payment source | Source: Ambulatory Visit | Attending: Obstetrics and Gynecology | Admitting: Obstetrics and Gynecology

## 2023-09-20 ENCOUNTER — Other Ambulatory Visit: Payer: Self-pay | Admitting: Obstetrics and Gynecology

## 2023-09-20 DIAGNOSIS — N920 Excessive and frequent menstruation with regular cycle: Secondary | ICD-10-CM

## 2023-09-20 DIAGNOSIS — Z01818 Encounter for other preprocedural examination: Secondary | ICD-10-CM

## 2023-09-20 DIAGNOSIS — Z01812 Encounter for preprocedural laboratory examination: Secondary | ICD-10-CM | POA: Diagnosis present

## 2023-09-20 LAB — CBC
HCT: 28.6 % — ABNORMAL LOW (ref 36.0–46.0)
Hemoglobin: 7.6 g/dL — ABNORMAL LOW (ref 12.0–15.0)
MCH: 18 pg — ABNORMAL LOW (ref 26.0–34.0)
MCHC: 26.6 g/dL — ABNORMAL LOW (ref 30.0–36.0)
MCV: 67.8 fL — ABNORMAL LOW (ref 80.0–100.0)
Platelets: 531 10*3/uL — ABNORMAL HIGH (ref 150–400)
RBC: 4.22 MIL/uL (ref 3.87–5.11)
RDW: 27.2 % — ABNORMAL HIGH (ref 11.5–15.5)
WBC: 8 10*3/uL (ref 4.0–10.5)
nRBC: 0.3 % — ABNORMAL HIGH (ref 0.0–0.2)

## 2023-09-21 ENCOUNTER — Other Ambulatory Visit: Payer: Self-pay

## 2023-09-21 ENCOUNTER — Observation Stay (HOSPITAL_COMMUNITY)
Admission: RE | Admit: 2023-09-21 | Discharge: 2023-09-22 | Disposition: A | Discharge status: Home / Self Care | Payer: No Typology Code available for payment source | Attending: Obstetrics and Gynecology | Admitting: Obstetrics and Gynecology

## 2023-09-21 DIAGNOSIS — N920 Excessive and frequent menstruation with regular cycle: Secondary | ICD-10-CM | POA: Diagnosis not present

## 2023-09-21 DIAGNOSIS — Z01818 Encounter for other preprocedural examination: Secondary | ICD-10-CM

## 2023-09-21 DIAGNOSIS — D649 Anemia, unspecified: Secondary | ICD-10-CM | POA: Diagnosis not present

## 2023-09-21 DIAGNOSIS — D500 Iron deficiency anemia secondary to blood loss (chronic): Principal | ICD-10-CM

## 2023-09-21 DIAGNOSIS — N946 Dysmenorrhea, unspecified: Secondary | ICD-10-CM | POA: Insufficient documentation

## 2023-09-21 DIAGNOSIS — D259 Leiomyoma of uterus, unspecified: Principal | ICD-10-CM | POA: Insufficient documentation

## 2023-09-21 LAB — CBC
HCT: 27.1 % — ABNORMAL LOW (ref 36.0–46.0)
Hemoglobin: 7 g/dL — ABNORMAL LOW (ref 12.0–15.0)
MCH: 17.8 pg — ABNORMAL LOW (ref 26.0–34.0)
MCHC: 25.8 g/dL — ABNORMAL LOW (ref 30.0–36.0)
MCV: 69 fL — ABNORMAL LOW (ref 80.0–100.0)
Platelets: 468 10*3/uL — ABNORMAL HIGH (ref 150–400)
RBC: 3.93 MIL/uL (ref 3.87–5.11)
RDW: 27.7 % — ABNORMAL HIGH (ref 11.5–15.5)
WBC: 8.5 10*3/uL (ref 4.0–10.5)
nRBC: 0 % (ref 0.0–0.2)

## 2023-09-21 LAB — PREPARE RBC (CROSSMATCH)

## 2023-09-21 MED ORDER — SODIUM CHLORIDE 0.9% FLUSH: 3.0000 mL | INTRAVENOUS | Status: DC | PRN

## 2023-09-21 MED ORDER — SODIUM CHLORIDE 0.9 % IV SOLN: 250.0000 mL | INTRAVENOUS | Status: DC | PRN

## 2023-09-21 MED ORDER — ACETAMINOPHEN 325 MG PO TABS
650.0000 mg | ORAL_TABLET | Freq: Once | ORAL | Status: AC
  Administered 2023-09-21: 650 mg via ORAL
  Filled 2023-09-21: qty 2

## 2023-09-21 MED ORDER — ONDANSETRON HCL 4 MG/2ML IJ SOLN: 4.0000 mg | Freq: Four times a day (QID) | INTRAMUSCULAR | Status: DC | PRN

## 2023-09-21 MED ORDER — DIPHENHYDRAMINE HCL 25 MG PO CAPS
25.0000 mg | ORAL_CAPSULE | Freq: Once | ORAL | Status: AC
  Administered 2023-09-21: 25 mg via ORAL
  Filled 2023-09-21: qty 1

## 2023-09-21 MED ORDER — SODIUM CHLORIDE 0.9% IV SOLUTION: Freq: Once | INTRAVENOUS | Status: AC

## 2023-09-21 MED ORDER — FUROSEMIDE 10 MG/ML IJ SOLN
20.0000 mg | Freq: Once | INTRAMUSCULAR | Status: AC
  Administered 2023-09-21: 20 mg via INTRAVENOUS
  Filled 2023-09-21: qty 2

## 2023-09-21 MED ORDER — ONDANSETRON HCL 4 MG PO TABS: 4.0000 mg | ORAL_TABLET | Freq: Four times a day (QID) | ORAL | Status: DC | PRN

## 2023-09-21 MED ORDER — ZOLPIDEM TARTRATE 5 MG PO TABS: 5.0000 mg | ORAL_TABLET | Freq: Every evening | ORAL | Status: DC | PRN

## 2023-09-21 MED ORDER — SODIUM CHLORIDE 0.9% FLUSH: 3.0000 mL | Freq: Two times a day (BID) | INTRAVENOUS | Status: DC

## 2023-09-21 NOTE — H&P (Signed)
 Subjective:    Chief Complaint(s): Preop vt/ Fibroids / Heavy Menses/Painful menses / Anemia    HPI:      General 48 y/o presents for  blood transfusion due to severe anemia.  Pt is schedule for a robotic assisted laparoscopic hysterectomy with bilateral salpingectomy on 09/22/2023 for the management of menorrhagia, dysmenorrhea, anemia, and fibroids.   IN REVIEW: She had an ultrasound performed 06/07/2023 that revealed a uterus measuring. 12.6 cm x 8.2 cmx 11.1 cm. 3 fiborids were noted the largest was 5.5 cm. Both ovaries were normal. Her endometrium was 16 mm. she has iron deficiency anemia due to chronic blood lose from menses. She has history of blood transfusion in 2020 and in 02/2023.      Current Medication:     Taking Iron.     Not-Taking Clindamycin HCl 150 MG Capsule 2 capsules Orally 4 times daily.  Medication List reviewed and reconciled with the patient.    Medical History: blood transfusion seasonal allergies    Allergies/Intolerance:      N.K.D.A.    Gyn History: Sexual activity currently sexually active.  Periods : regular.  LMP 08/18/2023.  Denies Birth control.  Last pap smear date 12/26/12, ASCUS.  Denies Last mammogram date 2024 normal per pt.  H/O Abnormal pap smear assessed with colposcopy, yes.  STD Gential warts.     OB History: Never been pregnant  per patient.     Surgical History: [facility] 01/2007 hysteroscopic Removal of Fibroids 04/2011    Hospitalization: No Hospitalization History.    Family History: Father: deceased, Heart Attack at 19, DM, HTN, diagnosed with Hypertension, Diabetes Mother: alive, HTN, DM, diagnosed with Diabetes, Hypertension Paternal Grand Father: deceased Paternal Grand Mother: deceased Maternal Grand Father: deceased Maternal Grand Mother: deceased Sister 1: alive Sister 2: alive denies any GYN family cancer hx.    Social History:      General Tobacco use:   cigarettes:  Current smoker, Frequency:   intermittent smoker, Tobacco history last updated  08/24/2023, Vaping  No.  EXPOSURE TO PASSIVE SMOKE:  yes.   Alcohol:  yes social. Caffeine:  yes 1 servings daily , coffee. Recreational drug use:  no.   Marital Status:  single.   Children:  none.   OCCUPATION:  [employer].    ROS:      CONSTITUTIONAL Chills  No.  Fatigue  No.  Fever  No.  Night sweats  No.  Recent travel outside [place]  No.  Sweats  No.  Weight change  No.       OPHTHALMOLOGY Blurring of vision  no.  Change in vision  no.  Double vision  no.       ENT Dizziness  no.  Nose bleeds  no.  Sore throat  no.  Teeth pain  no.       ALLERGY Hives  no.       CARDIOLOGY Chest pain  no.  High blood pressure  no.  Irregular heart beat  no.  Leg edema  no.  Palpitations  no.       RESPIRATORY Shortness of breath  no.  Cough  no.  Wheezing  no.       UROLOGY Pain with urination  no.  Urinary urgency  no.  Urinary frequency  no.  Urinary incontinence  no.  Difficulty urinating  No.  Blood in urine  No.       GASTROENTEROLOGY Abdominal pain  no.  Appetite change  no.  Bloating/belching  no.  Blood in stool or on toilet paper  no.  Change in bowel movements  no.  Constipation  no.  Diarrhea  no.  Difficulty swallowing  no.  Nausea  no.       FEMALE REPRODUCTIVE Vulvar pain  no.  Vulvar rash  no.  Abnormal vaginal bleeding  , heavy menses.  Breast pain  no.  Nipple discharge  no.  Pain with intercourse  no.  Pelvic pain  no.  Unusual vaginal discharge  no.  Vaginal itching  no.       MUSCULOSKELETAL Muscle aches  no.       NEUROLOGY Headache  no.  Tingling/numbness  no.  Weakness  no.       PSYCHOLOGY Depression  no.  Anxiety  no.  Nervousness  no.  Sleep disturbances  no.  Suicidal ideation  no .       ENDOCRINOLOGY Excessive thirst  no.  Excessive urination  no.  Hair loss  no.  Heat or cold intolerance  no.       HEMATOLOGY/LYMPH Abnormal bleeding  no.  Easy bruising  no.  Swollen glands  no.        DERMATOLOGY New/changing skin lesion  no.  Rash  no.  Sores  no.  Negative except as stated in HPI.     Objective:   Vitals:   09/21/23 1929 09/21/23 1954 09/21/23 2048 09/21/23 2121  BP: (!) 115/58 118/60 (!) 116/58 120/60  Pulse: 80 81 82 75  Resp: 17 18 16 16   Temp:  98.5 F (36.9 C) 98.8 F (37.1 C) 98.4 F (36.9 C)  TempSrc:  Oral Oral Oral  SpO2:  99%    Weight:      Height:           Past Results:    Examination:      General Examination CONSTITUTIONAL: alert, oriented, NAD.  SKIN:  moist, warm.  EYES:  Conjunctiva clear.  LUNGS: good I:E efffort noted, clear to auscultation bilaterally.  HEART: regular rate and rhythm.  ABDOMEN: soft, non-tender/non-distended, bowel sounds present.  FEMALE GENITOURINARY: normal external genitalia, labia - unremarkable, vagina - pink moist mucosa, no lesions or abnormal discharge, cervix - no discharge or lesions or CMT, adnexa - no masses or tenderness, uterus - nontender and 14 week size on palpation.  EXTREMITIES: no edema present.  PSYCH:  affect normal, good eye contact.     Physical Examination:      Chaperone present Chaperone present Debby Freiberg 08/24/2023 09:00:19 AM >, for pelvic exam.      Results for orders placed or performed during the hospital encounter of 09/21/23 (from the past 48 hours)  Type and screen     Status: None (Preliminary result)   Collection Time: 09/21/23  3:30 PM  Result Value Ref Range   ABO/RH(D) O POS    Antibody Screen NEG    Sample Expiration 09/24/2023,2359    Unit Number [id number]    Blood Component Type RBC LR PHER1    Unit division 00    Status of Unit ISSUED    Transfusion Status OK TO TRANSFUSE    Crossmatch Result      Compatible Performed at [facility], [address], [place], [place] [address]    Unit Number [id number]    Blood Component Type RED CELLS,LR    Unit division 00    Status of Unit ISSUED    Transfusion Status OK  TO TRANSFUSE     Crossmatch Result Compatible    Unit Number [id number]    Blood Component Type RBC LR PHER1    Unit division 00    Status of Unit ALLOCATED    Transfusion Status OK TO TRANSFUSE    Crossmatch Result Compatible   CBC     Status: Abnormal   Collection Time: 09/21/23  3:31 PM  Result Value Ref Range   WBC 8.5 4.0 - 10.5 K/uL   RBC 3.93 3.87 - 5.11 MIL/uL   Hemoglobin 7.0 (L) 12.0 - 15.0 g/dL    Comment: Reticulocyte Hemoglobin testing may be clinically indicated, consider ordering this additional test [id number]    HCT 27.1 (L) 36.0 - 46.0 %   MCV 69.0 (L) 80.0 - 100.0 fL   MCH 17.8 (L) 26.0 - 34.0 pg   MCHC 25.8 (L) 30.0 - 36.0 g/dL   RDW 95.6 (H) 21.3 - 08.6 %   Platelets 468 (H) 150 - 400 K/uL    Comment: REPEATED TO VERIFY   nRBC 0.0 0.0 - 0.2 %    Comment: Performed at [facility], [address] [place], [place] [address]  Prepare RBC (crossmatch)     Status: None   Collection Time: 09/21/23  4:14 PM  Result Value Ref Range   Order Confirmation      ORDER PROCESSED BY BLOOD BANK Performed at [facility], [address] [place], [place] [address]     Assessment:    Assessment: Menorrhagia with regular cycle - N92.0 (Primary)    Fibroid - D25.9    Dysmenorrhea - N94.6    Chronic blood loss anemia - D50.0        Plan:    Treatment:     Menorrhagia with regular cycle  Lab:HGB (Collection Date & Time - 08/24/2023 09:07 AM)                Notes: Planning robotic-assisted laparoscopic hysterectomy with bilateral salpingectomy. Pt advised she may go home sameday or stay overnight, or 2 days if conversion to larger incision. She is advised that in order to be discharged from hospital, she will need to be able to ambulate, urinate, tolerate food by mouth, and pain must be controlled with medication by mouth. Discussed risks of hysterectomy including but not limited to infection, bleeding, conversion to larger incision, damage to her bowel, bladder, or  ureters, with the need for further surgery. Discussed risk of blood transfusion and risk of HIV or hep B&C with blood transfusion. Pt is aware of risks and desires blood transfusion if needed. Pt advised to avoid NSAIDs (Aspirin, Aleve, Advil, Ibuprofen, Motrin) from now until surgery given risk of bleeding during surgery. She may take Tylenol for pain management. She is advised to avoid eating or drinking starting midnight prior to surgery. Discussed post-surgery avoidance of driving for 1 week and avoidance of lifting weight greater than 10 lbs or intercourse for 6-8 weeks after procedure. Follow up in 4 weeks for 2 wk post-op visit.     Fibroid               Notes: Planning robotic-assisted laparoscopic hysterectomy with bilateral salpingectomy. Pt advised she may go home sameday or stay overnight, or 2 days if conversion to larger incision. She is advised that in order to be discharged from hospital, she will need to be able to ambulate, urinate, tolerate food by mouth, and pain must be controlled with medication by mouth.  Discussed risks of hysterectomy including but not limited to infection, bleeding, conversion to larger incision, damage to her bowel, bladder, or ureters, with the need for further surgery. Discussed risk of blood transfusion and risk of HIV or hep B&C with blood transfusion. Pt is aware of risks and desires blood transfusion if needed. Pt advised to avoid NSAIDs (Aspirin, Aleve, Advil, Ibuprofen, Motrin) from now until surgery given risk of bleeding during surgery. She may take Tylenol for pain management. She is advised to avoid eating or drinking starting midnight prior to surgery. Discussed post-surgery avoidance of driving for 1 week and avoidance of lifting weight greater than 10 lbs or intercourse for 6-8 weeks after procedure. Follow up in 4 weeks for 2 wk post-op visit.     Dysmenorrhea Start Ibuprofen Tablet, 800 MG, 1 tablet with food or milk as needed, Orally, every 8 hrs as  needed, 3 days, 10 Tablet, Refills 0               Notes: Planning robotic-assisted laparoscopic hysterectomy with bilateral salpingectomy. Pt advised she may go home sameday or stay overnight, or 2 days if conversion to larger incision. She is advised that in order to be discharged from hospital, she will need to be able to ambulate, urinate, tolerate food by mouth, and pain must be controlled with medication by mouth. Discussed risks of hysterectomy including but not limited to infection, bleeding, conversion to larger incision, damage to her bowel, bladder, or ureters, with the need for further surgery. Discussed risk of blood transfusion and risk of HIV or hep B&C with blood transfusion. Pt is aware of risks and desires blood transfusion if needed. Pt advised to avoid NSAIDs (Aspirin, Aleve, Advil, Ibuprofen, Motrin) from now until surgery given risk of bleeding during surgery. She may take Tylenol for pain management. She is advised to avoid eating or drinking starting midnight prior to surgery. Discussed post-surgery avoidance of driving for 1 week and avoidance of lifting weight greater than 10 lbs or intercourse for 6-8 weeks after procedure. Follow up in 4 weeks for 2 wk post-op visit.     Chronic blood loss anemia- Hgb 7.0- plan 3 units RBC's. R/b/a of transfusion discussed with the patient including but not limited to transfusion reaction/ hiv/ hepB&C. Pt voiced understanding and agrees with transfusion in preparation for surgery tomorrow.

## 2023-09-21 NOTE — Anesthesia Preprocedure Evaluation (Signed)
 Anesthesia Evaluation  Patient identified by MRN, date of birth, ID band Patient awake    Reviewed: Allergy & Precautions, NPO status , Patient's Chart, lab work & pertinent test results  Airway Mallampati: II  TM Distance: >3 FB Neck ROM: Full    Dental  (+) Dental Advisory Given, Teeth Intact   Pulmonary Current Smoker   Pulmonary exam normal breath sounds clear to auscultation       Cardiovascular negative cardio ROS Normal cardiovascular exam Rhythm:Regular Rate:Normal     Neuro/Psych negative neurological ROS     GI/Hepatic Neg liver ROS,GERD  ,,  Endo/Other  negative endocrine ROS    Renal/GU negative Renal ROS     Musculoskeletal negative musculoskeletal ROS (+)    Abdominal  (+) + obese  Peds  Hematology  (+) Blood dyscrasia, anemia   Anesthesia Other Findings   Reproductive/Obstetrics                             Anesthesia Physical Anesthesia Plan  ASA: 3  Anesthesia Plan: General   Post-op Pain Management: Tylenol PO (pre-op)* and Gabapentin PO (pre-op)*   Induction: Intravenous  PONV Risk Score and Plan: 4 or greater and Ondansetron, Dexamethasone, Treatment may vary due to age or medical condition and Midazolam  Airway Management Planned: Oral ETT  Additional Equipment:   Intra-op Plan:   Post-operative Plan: Extubation in OR  Informed Consent: I have reviewed the patients History and Physical, chart, labs and discussed the procedure including the risks, benefits and alternatives for the proposed anesthesia with the patient or authorized representative who has indicated his/her understanding and acceptance.     Dental advisory given  Plan Discussed with: CRNA  Anesthesia Plan Comments: (2 x PIV)       Anesthesia Quick Evaluation

## 2023-09-22 ENCOUNTER — Encounter (HOSPITAL_COMMUNITY): Payer: Self-pay | Admitting: Obstetrics and Gynecology

## 2023-09-22 ENCOUNTER — Other Ambulatory Visit: Payer: Self-pay

## 2023-09-22 ENCOUNTER — Inpatient Hospital Stay (HOSPITAL_BASED_OUTPATIENT_CLINIC_OR_DEPARTMENT_OTHER): Payer: No Typology Code available for payment source | Admitting: Anesthesiology

## 2023-09-22 ENCOUNTER — Inpatient Hospital Stay (HOSPITAL_COMMUNITY): Payer: No Typology Code available for payment source | Admitting: Anesthesiology

## 2023-09-22 ENCOUNTER — Encounter (HOSPITAL_COMMUNITY)
Admission: RE | Disposition: A | Discharge status: Home / Self Care | Payer: Self-pay | Source: Home / Self Care | Attending: Obstetrics and Gynecology

## 2023-09-22 DIAGNOSIS — Z01818 Encounter for other preprocedural examination: Secondary | ICD-10-CM

## 2023-09-22 DIAGNOSIS — D259 Leiomyoma of uterus, unspecified: Secondary | ICD-10-CM

## 2023-09-22 HISTORY — PX: ROBOTIC ASSISTED LAPAROSCOPIC HYSTERECTOMY AND SALPINGECTOMY: SHX6379

## 2023-09-22 LAB — CBC
HCT: 34.1 % — ABNORMAL LOW (ref 36.0–46.0)
Hemoglobin: 10.2 g/dL — ABNORMAL LOW (ref 12.0–15.0)
MCH: 21.8 pg — ABNORMAL LOW (ref 26.0–34.0)
MCHC: 29.9 g/dL — ABNORMAL LOW (ref 30.0–36.0)
MCV: 73 fL — ABNORMAL LOW (ref 80.0–100.0)
Platelets: 361 10*3/uL (ref 150–400)
RBC: 4.67 MIL/uL (ref 3.87–5.11)
RDW: 27.3 % — ABNORMAL HIGH (ref 11.5–15.5)
WBC: 7.4 10*3/uL (ref 4.0–10.5)
nRBC: 0 % (ref 0.0–0.2)

## 2023-09-22 LAB — BPAM RBC
Blood Product Expiration Date: 202503162359
Blood Product Expiration Date: 202503162359
Blood Product Expiration Date: 202503172359
ISSUE DATE / TIME: 202502261655
ISSUE DATE / TIME: 202502262053
ISSUE DATE / TIME: 202502262333
Unit Type and Rh: 5100
Unit Type and Rh: 5100
Unit Type and Rh: 5100

## 2023-09-22 LAB — TYPE AND SCREEN
ABO/RH(D): O POS
Antibody Screen: NEGATIVE
Unit division: 0
Unit division: 0
Unit division: 0

## 2023-09-22 SURGERY — XI ROBOTIC ASSISTED LAPAROSCOPIC HYSTERECTOMY AND SALPINGECTOMY
Anesthesia: General | Site: Pelvis | Laterality: Bilateral

## 2023-09-22 MED ORDER — DROPERIDOL 2.5 MG/ML IJ SOLN: 0.6250 mg | Freq: Once | INTRAMUSCULAR | Status: DC | PRN

## 2023-09-22 MED ORDER — POVIDONE-IODINE 10 % EX SWAB
2.0000 | Freq: Once | CUTANEOUS | Status: AC
  Administered 2023-09-22: 2 via TOPICAL

## 2023-09-22 MED ORDER — METHYLENE BLUE (ANTIDOTE) 1 % IV SOLN
INTRAVENOUS | Status: AC
  Filled 2023-09-22: qty 10

## 2023-09-22 MED ORDER — DEXAMETHASONE SODIUM PHOSPHATE 10 MG/ML IJ SOLN
INTRAMUSCULAR | Status: DC | PRN
  Administered 2023-09-22: 8 mg via INTRAVENOUS

## 2023-09-22 MED ORDER — IBUPROFEN 600 MG PO TABS: 600.0000 mg | ORAL_TABLET | Freq: Four times a day (QID) | ORAL | 1 refills | Status: AC | PRN

## 2023-09-22 MED ORDER — SODIUM CHLORIDE 0.9 % IR SOLN
Status: DC | PRN
  Administered 2023-09-22: 1000 mL

## 2023-09-22 MED ORDER — SUGAMMADEX SODIUM 200 MG/2ML IV SOLN
INTRAVENOUS | Status: DC | PRN
  Administered 2023-09-22: 200 mg via INTRAVENOUS

## 2023-09-22 MED ORDER — SODIUM CHLORIDE (PF) 0.9 % IJ SOLN
INTRAMUSCULAR | Status: AC
  Filled 2023-09-22: qty 50

## 2023-09-22 MED ORDER — PROPOFOL 10 MG/ML IV BOLUS
INTRAVENOUS | Status: AC
  Filled 2023-09-22: qty 20

## 2023-09-22 MED ORDER — PANTOPRAZOLE SODIUM 40 MG PO TBEC
40.0000 mg | DELAYED_RELEASE_TABLET | Freq: Every day | ORAL | Status: DC
  Administered 2023-09-22: 40 mg via ORAL
  Filled 2023-09-22: qty 1

## 2023-09-22 MED ORDER — IBUPROFEN 600 MG PO TABS
600.0000 mg | ORAL_TABLET | Freq: Four times a day (QID) | ORAL | Status: DC
  Administered 2023-09-22: 600 mg via ORAL
  Filled 2023-09-22: qty 1

## 2023-09-22 MED ORDER — OXYCODONE HCL 5 MG PO TABS: 5.0000 mg | ORAL_TABLET | ORAL | Status: DC | PRN

## 2023-09-22 MED ORDER — PROPOFOL 10 MG/ML IV BOLUS
INTRAVENOUS | Status: DC | PRN
  Administered 2023-09-22: 200 mg via INTRAVENOUS

## 2023-09-22 MED ORDER — SODIUM CHLORIDE 0.9 % IV SOLN
2.0000 g | INTRAVENOUS | Status: AC
  Administered 2023-09-22: 2 g via INTRAVENOUS
  Filled 2023-09-22: qty 2

## 2023-09-22 MED ORDER — PHENYLEPHRINE 80 MCG/ML (10ML) SYRINGE FOR IV PUSH (FOR BLOOD PRESSURE SUPPORT)
PREFILLED_SYRINGE | INTRAVENOUS | Status: AC
  Filled 2023-09-22: qty 10

## 2023-09-22 MED ORDER — SIMETHICONE 80 MG PO CHEW: 80.0000 mg | CHEWABLE_TABLET | Freq: Four times a day (QID) | ORAL | Status: DC | PRN

## 2023-09-22 MED ORDER — SUGAMMADEX SODIUM 200 MG/2ML IV SOLN
INTRAVENOUS | Status: AC
  Filled 2023-09-22: qty 2

## 2023-09-22 MED ORDER — STERILE WATER FOR IRRIGATION IR SOLN
Status: DC | PRN
  Administered 2023-09-22: 1000 mL

## 2023-09-22 MED ORDER — LACTATED RINGERS IV SOLN: INTRAVENOUS | Status: DC

## 2023-09-22 MED ORDER — ROPIVACAINE HCL 5 MG/ML IJ SOLN
INTRAMUSCULAR | Status: AC
  Filled 2023-09-22: qty 30

## 2023-09-22 MED ORDER — PHENYLEPHRINE HCL-NACL 20-0.9 MG/250ML-% IV SOLN
INTRAVENOUS | Status: DC | PRN
  Administered 2023-09-22: 25 ug/min via INTRAVENOUS

## 2023-09-22 MED ORDER — ALUM & MAG HYDROXIDE-SIMETH 200-200-20 MG/5ML PO SUSP: 30.0000 mL | ORAL | Status: DC | PRN

## 2023-09-22 MED ORDER — BUPIVACAINE LIPOSOME 1.3 % IJ SUSP
INTRAMUSCULAR | Status: DC | PRN
  Administered 2023-09-22: 10 mL
  Administered 2023-09-22: 40 mL

## 2023-09-22 MED ORDER — LIDOCAINE 2% (20 MG/ML) 5 ML SYRINGE
INTRAMUSCULAR | Status: DC | PRN
  Administered 2023-09-22: 80 mg via INTRAVENOUS

## 2023-09-22 MED ORDER — GABAPENTIN 300 MG PO CAPS
300.0000 mg | ORAL_CAPSULE | Freq: Once | ORAL | Status: AC
  Administered 2023-09-22: 300 mg via ORAL
  Filled 2023-09-22: qty 1

## 2023-09-22 MED ORDER — PHENYLEPHRINE 80 MCG/ML (10ML) SYRINGE FOR IV PUSH (FOR BLOOD PRESSURE SUPPORT)
PREFILLED_SYRINGE | INTRAVENOUS | Status: DC | PRN
  Administered 2023-09-22: 160 ug via INTRAVENOUS

## 2023-09-22 MED ORDER — ONDANSETRON HCL 4 MG/2ML IJ SOLN
INTRAMUSCULAR | Status: AC
Start: 2023-09-22 — End: ?
  Filled 2023-09-22: qty 2

## 2023-09-22 MED ORDER — ROCURONIUM BROMIDE 10 MG/ML (PF) SYRINGE
PREFILLED_SYRINGE | INTRAVENOUS | Status: DC | PRN
  Administered 2023-09-22: 10 mg via INTRAVENOUS
  Administered 2023-09-22 (×2): 20 mg via INTRAVENOUS
  Administered 2023-09-22: 80 mg via INTRAVENOUS
  Administered 2023-09-22: 10 mg via INTRAVENOUS

## 2023-09-22 MED ORDER — FENTANYL CITRATE (PF) 250 MCG/5ML IJ SOLN
INTRAMUSCULAR | Status: DC | PRN
  Administered 2023-09-22: 150 ug via INTRAVENOUS
  Administered 2023-09-22 (×2): 50 ug via INTRAVENOUS

## 2023-09-22 MED ORDER — KETOROLAC TROMETHAMINE 30 MG/ML IJ SOLN
INTRAMUSCULAR | Status: DC | PRN
  Administered 2023-09-22: 30 mg via INTRAVENOUS

## 2023-09-22 MED ORDER — GABAPENTIN 300 MG PO CAPS
ORAL_CAPSULE | ORAL | Status: AC
  Filled 2023-09-22: qty 1

## 2023-09-22 MED ORDER — ACETAMINOPHEN 500 MG PO TABS
1000.0000 mg | ORAL_TABLET | Freq: Once | ORAL | Status: AC
  Administered 2023-09-22: 1000 mg via ORAL
  Filled 2023-09-22: qty 2

## 2023-09-22 MED ORDER — LACTATED RINGERS IV SOLN: INTRAVENOUS | Status: DC | PRN

## 2023-09-22 MED ORDER — CHLORHEXIDINE GLUCONATE 0.12 % MT SOLN
15.0000 mL | Freq: Once | OROMUCOSAL | Status: AC
  Administered 2023-09-22: 15 mL via OROMUCOSAL
  Filled 2023-09-22: qty 15

## 2023-09-22 MED ORDER — SENNA 8.6 MG PO TABS
1.0000 | ORAL_TABLET | Freq: Two times a day (BID) | ORAL | Status: DC
  Administered 2023-09-22: 8.6 mg via ORAL
  Filled 2023-09-22: qty 1

## 2023-09-22 MED ORDER — HEMOSTATIC AGENTS (NO CHARGE) OPTIME
TOPICAL | Status: DC | PRN
  Administered 2023-09-22: 1 via TOPICAL

## 2023-09-22 MED ORDER — MIDAZOLAM HCL 2 MG/2ML IJ SOLN
INTRAMUSCULAR | Status: AC
  Filled 2023-09-22: qty 2

## 2023-09-22 MED ORDER — ZOLPIDEM TARTRATE 5 MG PO TABS: 5.0000 mg | ORAL_TABLET | Freq: Every evening | ORAL | Status: DC | PRN

## 2023-09-22 MED ORDER — ONDANSETRON HCL 4 MG/2ML IJ SOLN
INTRAMUSCULAR | Status: DC | PRN
  Administered 2023-09-22: 4 mg via INTRAVENOUS

## 2023-09-22 MED ORDER — HYDROMORPHONE HCL 1 MG/ML IJ SOLN
0.2000 mg | INTRAMUSCULAR | Status: DC | PRN
  Administered 2023-09-22: 0.2 mg via INTRAVENOUS
  Filled 2023-09-22: qty 1

## 2023-09-22 MED ORDER — MENTHOL 3 MG MT LOZG
1.0000 | LOZENGE | OROMUCOSAL | Status: DC | PRN
  Administered 2023-09-22 (×2): 3 mg via ORAL
  Filled 2023-09-22: qty 9

## 2023-09-22 MED ORDER — SILVER NITRATE-POT NITRATE 75-25 % EX MISC
CUTANEOUS | Status: AC
  Filled 2023-09-22: qty 10

## 2023-09-22 MED ORDER — ROCURONIUM BROMIDE 10 MG/ML (PF) SYRINGE
PREFILLED_SYRINGE | INTRAVENOUS | Status: AC
  Filled 2023-09-22: qty 20

## 2023-09-22 MED ORDER — HYDROMORPHONE HCL 1 MG/ML IJ SOLN
0.2500 mg | INTRAMUSCULAR | Status: DC | PRN
  Administered 2023-09-22: 0.25 mg via INTRAVENOUS

## 2023-09-22 MED ORDER — LIDOCAINE 2% (20 MG/ML) 5 ML SYRINGE
INTRAMUSCULAR | Status: AC
  Filled 2023-09-22: qty 5

## 2023-09-22 MED ORDER — HYDROMORPHONE HCL 1 MG/ML IJ SOLN
INTRAMUSCULAR | Status: AC
  Filled 2023-09-22: qty 1

## 2023-09-22 MED ORDER — BUPIVACAINE HCL (PF) 0.25 % IJ SOLN
INTRAMUSCULAR | Status: AC
  Filled 2023-09-22: qty 30

## 2023-09-22 MED ORDER — ONDANSETRON HCL 4 MG/2ML IJ SOLN: 4.0000 mg | Freq: Four times a day (QID) | INTRAMUSCULAR | Status: DC | PRN

## 2023-09-22 MED ORDER — ONDANSETRON HCL 4 MG PO TABS
4.0000 mg | ORAL_TABLET | Freq: Four times a day (QID) | ORAL | Status: DC | PRN
Start: 2023-09-22 — End: 2023-09-23

## 2023-09-22 MED ORDER — MIDAZOLAM HCL 2 MG/2ML IJ SOLN
INTRAMUSCULAR | Status: DC | PRN
  Administered 2023-09-22: 2 mg via INTRAVENOUS

## 2023-09-22 MED ORDER — DEXAMETHASONE SODIUM PHOSPHATE 10 MG/ML IJ SOLN
INTRAMUSCULAR | Status: AC
  Filled 2023-09-22: qty 1

## 2023-09-22 MED ORDER — ORAL CARE MOUTH RINSE: 15.0000 mL | Freq: Once | OROMUCOSAL | Status: AC

## 2023-09-22 MED ORDER — SODIUM CHLORIDE 0.9 % IV SOLN
INTRAVENOUS | Status: DC | PRN
  Administered 2023-09-22: 10 mL
  Administered 2023-09-22: 50 mL

## 2023-09-22 MED ORDER — OXYCODONE HCL 5 MG PO TABS
5.0000 mg | ORAL_TABLET | Freq: Four times a day (QID) | ORAL | 0 refills | Status: AC | PRN
Start: 2023-09-22 — End: ?

## 2023-09-22 MED ORDER — BUPIVACAINE LIPOSOME 1.3 % IJ SUSP
INTRAMUSCULAR | Status: AC
  Filled 2023-09-22: qty 20

## 2023-09-22 MED ORDER — ACETAMINOPHEN 500 MG PO TABS
1000.0000 mg | ORAL_TABLET | Freq: Four times a day (QID) | ORAL | Status: DC
  Administered 2023-09-22: 1000 mg via ORAL
  Filled 2023-09-22: qty 2

## 2023-09-22 MED ORDER — FENTANYL CITRATE (PF) 250 MCG/5ML IJ SOLN
INTRAMUSCULAR | Status: AC
Start: 2023-09-22 — End: ?
  Filled 2023-09-22: qty 5

## 2023-09-22 MED ORDER — ACETAMINOPHEN 500 MG PO TABS: 1000.0000 mg | ORAL_TABLET | Freq: Three times a day (TID) | ORAL | 0 refills | Status: AC | PRN

## 2023-09-22 SURGICAL SUPPLY — 61 items
APPLICATOR ARISTA FLEXITIP XL (MISCELLANEOUS) IMPLANT
CANNULA CAP OBTURATR AIRSEAL 8 (CAP) IMPLANT
CATH ROBINSON RED A/P 14FR (CATHETERS) IMPLANT
CAUTERY HOOK MNPLR 1.6 DVNC XI (INSTRUMENTS) ×2 IMPLANT
COVER BACK TABLE 60X90IN (DRAPES) ×2 IMPLANT
COVER TIP SHEARS 8 DVNC (MISCELLANEOUS) ×2 IMPLANT
DEFOGGER SCOPE WARMER CLEARIFY (MISCELLANEOUS) ×2 IMPLANT
DERMABOND ADVANCED .7 DNX12 (GAUZE/BANDAGES/DRESSINGS) ×2 IMPLANT
DILATOR CANAL MILEX (MISCELLANEOUS) ×2 IMPLANT
DRAPE ARM DVNC X/XI (DISPOSABLE) ×8 IMPLANT
DRAPE COLUMN DVNC XI (DISPOSABLE) ×2 IMPLANT
DRAPE UTILITY 15X26 TOWEL STRL (DRAPES) ×2 IMPLANT
DRIVER NDL MEGA 8 DVNC XI (INSTRUMENTS) ×4 IMPLANT
DRIVER NDLE MEGA DVNC XI (INSTRUMENTS) ×1 IMPLANT
DURAPREP 26ML APPLICATOR (WOUND CARE) ×2 IMPLANT
ELECT REM PT RETURN 9FT ADLT (ELECTROSURGICAL) ×1 IMPLANT
ELECTRODE REM PT RTRN 9FT ADLT (ELECTROSURGICAL) ×2 IMPLANT
FORCEPS BPLR FENES DVNC XI (FORCEP) IMPLANT
FORCEPS BPLR LNG DVNC XI (INSTRUMENTS) ×2 IMPLANT
FORCEPS PROGRASP DVNC XI (FORCEP) ×2 IMPLANT
FORCEPS TENACULUM DVNC XI (FORCEP) IMPLANT
GAUZE 4X4 16PLY ~~LOC~~+RFID DBL (SPONGE) ×2 IMPLANT
GLOVE BIO SURGEON STRL SZ7 (GLOVE) IMPLANT
GLOVE BIOGEL M 6.5 STRL (GLOVE) IMPLANT
GLOVE BIOGEL PI IND STRL 7.0 (GLOVE) ×12 IMPLANT
GLOVE BIOGEL PI IND STRL 7.5 (GLOVE) IMPLANT
GLOVE SURG ENC TEXT LTX SZ7 (GLOVE) ×6 IMPLANT
GLOVE SURG SS PI 7.0 STRL IVOR (GLOVE) IMPLANT
GLOVE SURG SYN 6.5 ES PF (GLOVE) ×2 IMPLANT
GLOVE SURG SYN 6.5 PF PI (GLOVE) IMPLANT
GOWN STRL REUS W/ TWL LRG LVL3 (GOWN DISPOSABLE) IMPLANT
GRASPER COBRA DVNC RU (INSTRUMENTS) ×2 IMPLANT
HEMOSTAT ARISTA ABSORB 3G PWDR (HEMOSTASIS) IMPLANT
HIBICLENS CHG 4% 4OZ BTL (MISCELLANEOUS) ×4 IMPLANT
IRRIGATION STRYKERFLOW (MISCELLANEOUS) ×2 IMPLANT
IRRIGATOR STRYKERFLOW (MISCELLANEOUS) ×1 IMPLANT
IV NS 1000ML BAXH (IV SOLUTION) IMPLANT
KIT PINK PAD W/HEAD ARE REST (MISCELLANEOUS) ×1 IMPLANT
KIT PINK PAD W/HEAD ARM REST (MISCELLANEOUS) ×4 IMPLANT
LEGGING LITHOTOMY PAIR STRL (DRAPES) ×2 IMPLANT
MANIPULATOR ADVINCU DEL 3.0 PL (MISCELLANEOUS) IMPLANT
OBTURATOR OPTICAL STND 8 DVNC (TROCAR) ×1 IMPLANT
OBTURATOR OPTICALSTD 8 DVNC (TROCAR) IMPLANT
OCCLUDER COLPOPNEUMO (BALLOONS) ×2 IMPLANT
PACK ROBOT WH (CUSTOM PROCEDURE TRAY) ×2 IMPLANT
PACK ROBOTIC GOWN (GOWN DISPOSABLE) ×2 IMPLANT
PAD OB MATERNITY 11 LF (PERSONAL CARE ITEMS) ×2 IMPLANT
PROTECTOR NERVE ULNAR (MISCELLANEOUS) ×2 IMPLANT
SCISSORS MNPLR CVD DVNC XI (INSTRUMENTS) ×2 IMPLANT
SEAL UNIV 5-12 XI (MISCELLANEOUS) ×8 IMPLANT
SEALER VESSEL EXT DVNC XI (MISCELLANEOUS) IMPLANT
SET IRRIG Y TYPE TUR BLADDER L (SET/KITS/TRAYS/PACK) IMPLANT
SET TUBE FILTERED XL AIRSEAL (SET/KITS/TRAYS/PACK) IMPLANT
SPIKE FLUID TRANSFER (MISCELLANEOUS) ×4 IMPLANT
SUT VIC AB 0 CT1 27XBRD ANBCTR (SUTURE) ×4 IMPLANT
SUT VIC AB 0 CT1 36 (SUTURE) IMPLANT
SUT VICRYL RAPIDE 4/0 PS 2 (SUTURE) ×6 IMPLANT
SUT VLOC 180 0 9IN GS21 (SUTURE) ×2 IMPLANT
TOWEL GREEN STERILE (TOWEL DISPOSABLE) ×2 IMPLANT
UNDERPAD 30X36 HEAVY ABSORB (UNDERPADS AND DIAPERS) ×2 IMPLANT
WATER STERILE IRR 1000ML POUR (IV SOLUTION) ×2 IMPLANT

## 2023-09-22 NOTE — Discharge Summary (Signed)
 Physician Discharge Summary  Patient ID: Sally Price MRN: [id number] DOB/AGE: Mar 30, 1976 47 y.o.  Admit date: 09/21/2023 Discharge date: 09/22/2023  Admission Diagnoses:Anemia/ Uterine fibroids/ Menorrhagia / Dysmenorrhea  Discharge Diagnoses: Same  and s/p Robotic assisted laparoscopic hysterectomy with bilateral salpingectomy  Principal Problem:   Menorrhagia   Discharged Condition: stable  Hospital Course: Pt was admitted on 09/21/2023 with Anemia Hgb 7.0 on admission.  She was transfused with 3 units /RBC's in preparation for surgery. She underwent a robotic assisted laparoscopic hysterectomy with bilateral salpingectomy on 09/22/2023. She did well postoperatively with return of bowel and bladder function. She is discharged home on pod #0 in stable condition. Tolerating po, ambulating and pain is well controlled with ibuprofen and tylenol.    Consults: None  Significant Diagnostic Studies: labs:  Recent Results (from the past 2160 hours)  Prepare RBC (crossmatch)     Status: None   Collection Time: 08/26/23  8:06 AM  Result Value Ref Range   Order Confirmation      ORDER PROCESSED BY BLOOD BANK Performed at [facility], [address] [place], [place] [address]   Type and screen     Status: None   Collection Time: 08/26/23  8:25 AM  Result Value Ref Range   ABO/RH(D) O POS    Antibody Screen NEG    Sample Expiration 08/29/2023,2359    Unit Number [id number]    Blood Component Type RED CELLS,LR    Unit division 00    Status of Unit ISSUED,FINAL    Transfusion Status OK TO TRANSFUSE    Crossmatch Result Compatible    Unit Number [id number]    Blood Component Type RED CELLS,LR    Unit division 00    Status of Unit ISSUED,FINAL    Transfusion Status OK TO TRANSFUSE    Crossmatch Result      Compatible Performed at [facility], [address], [place], [place] [address]   Hemoglobin and hematocrit, blood     Status:  Abnormal   Collection Time: 08/26/23  8:25 AM  Result Value Ref Range   Hemoglobin 5.1 (LL) 12.0 - 15.0 g/dL    Comment: This critical result has verified and been called to [facility], A RN by SARA SHAEFFER on 01 31 2025 at 0918, and has been read back.    HCT 20.6 (L) 36.0 - 46.0 %    Comment: Performed at [facility], [address], [place], [place] [address]  BPAM RBC     Status: None   Collection Time: 08/26/23  8:25 AM  Result Value Ref Range   ISSUE DATE / TIME 027253664403    Blood Product Unit Number [id number]    PRODUCT CODE [id number]    Unit Type and Rh 5100    Blood Product Expiration Date 433295188416    ISSUE DATE / TIME 606301601093    Blood Product Unit Number [id number]    PRODUCT CODE [id number]    Unit Type and Rh 5100    Blood Product Expiration Date 831517616073   Hemoglobin and hematocrit, blood     Status: Abnormal   Collection Time: 08/26/23  3:45 PM  Result Value Ref Range   Hemoglobin 7.6 (L) 12.0 - 15.0 g/dL    Comment: REPEATED TO VERIFY POST TRANSFUSION SPECIMEN    HCT 26.9 (L) 36.0 - 46.0 %    Comment: Performed at [facility], [address], [place], [place] [address]  CBC  Status: Abnormal   Collection Time: 09/06/23  9:24 AM  Result Value Ref Range   WBC 8.2 4.0 - 10.5 K/uL   RBC 4.06 3.87 - 5.11 MIL/uL   Hemoglobin 7.5 (L) 12.0 - 15.0 g/dL    Comment: Reticulocyte Hemoglobin testing may be clinically indicated, consider ordering this additional test [id number]    HCT 28.4 (L) 36.0 - 46.0 %   MCV 70.0 (L) 80.0 - 100.0 fL   MCH 18.5 (L) 26.0 - 34.0 pg   MCHC 26.4 (L) 30.0 - 36.0 g/dL   RDW 27.2 (H) 53.6 - 64.4 %   Platelets 235 150 - 400 K/uL   nRBC 0.0 0.0 - 0.2 %    Comment: Performed at [facility], [address], [place], [place] [address]  CBC     Status: Abnormal   Collection Time: 09/20/23  9:06 AM  Result Value Ref Range   WBC 8.0 4.0 - 10.5 K/uL   RBC 4.22 3.87  - 5.11 MIL/uL   Hemoglobin 7.6 (L) 12.0 - 15.0 g/dL    Comment: Reticulocyte Hemoglobin testing may be clinically indicated, consider ordering this additional test [id number]    HCT 28.6 (L) 36.0 - 46.0 %   MCV 67.8 (L) 80.0 - 100.0 fL   MCH 18.0 (L) 26.0 - 34.0 pg   MCHC 26.6 (L) 30.0 - 36.0 g/dL   RDW 56.4 (H) 33.2 - 95.1 %   Platelets 531 (H) 150 - 400 K/uL    Comment: REPEATED TO VERIFY   nRBC 0.3 (H) 0.0 - 0.2 %    Comment: Performed at [facility], [address] [place], [place] [address]  Type and screen     Status: None   Collection Time: 09/21/23  3:30 PM  Result Value Ref Range   ABO/RH(D) O POS    Antibody Screen NEG    Sample Expiration 09/24/2023,2359    Unit Number [id number]    Blood Component Type RBC LR PHER1    Unit division 00    Status of Unit ISSUED,FINAL    Transfusion Status OK TO TRANSFUSE    Crossmatch Result      Compatible Performed at [facility], [address] [place], [place] [address]    Unit Number [id number]    Blood Component Type RED CELLS,LR    Unit division 00    Status of Unit ISSUED,FINAL    Transfusion Status OK TO TRANSFUSE    Crossmatch Result Compatible    Unit Number [id number]    Blood Component Type RBC LR PHER1    Unit division 00    Status of Unit ISSUED,FINAL    Transfusion Status OK TO TRANSFUSE    Crossmatch Result Compatible   BPAM RBC     Status: None   Collection Time: 09/21/23  3:30 PM  Result Value Ref Range   ISSUE DATE / TIME 371062694854    Blood Product Unit Number [id number]    PRODUCT CODE [id number]    Unit Type and Rh 5100    Blood Product Expiration Date 678938101751    ISSUE DATE / TIME 025852778242    Blood Product Unit Number [id number]    PRODUCT CODE [id number]    Unit Type and Rh 5100    Blood Product Expiration Date 932671245809    ISSUE DATE / TIME 983382505397    Blood Product Unit Number [id number]    PRODUCT CODE [id number]    Unit Type and Rh 5100  Blood Product Expiration Date 130865784696   CBC     Status: Abnormal   Collection Time: 09/21/23  3:31 PM  Result Value Ref Range   WBC 8.5 4.0 - 10.5 K/uL   RBC 3.93 3.87 - 5.11 MIL/uL   Hemoglobin 7.0 (L) 12.0 - 15.0 g/dL    Comment: Reticulocyte Hemoglobin testing may be clinically indicated, consider ordering this additional test [id number]    HCT 27.1 (L) 36.0 - 46.0 %   MCV 69.0 (L) 80.0 - 100.0 fL   MCH 17.8 (L) 26.0 - 34.0 pg   MCHC 25.8 (L) 30.0 - 36.0 g/dL   RDW 24.4 (H) 01.0 - 27.2 %   Platelets 468 (H) 150 - 400 K/uL    Comment: REPEATED TO VERIFY   nRBC 0.0 0.0 - 0.2 %    Comment: Performed at [facility], [address] [place], [place] [address]  Prepare RBC (crossmatch)     Status: None   Collection Time: 09/21/23  4:14 PM  Result Value Ref Range   Order Confirmation      ORDER PROCESSED BY BLOOD BANK Performed at [facility], [address] [place], [place] [address]   CBC     Status: Abnormal   Collection Time: 09/22/23  4:17 AM  Result Value Ref Range   WBC 7.4 4.0 - 10.5 K/uL   RBC 4.67 3.87 - 5.11 MIL/uL   Hemoglobin 10.2 (L) 12.0 - 15.0 g/dL    Comment: REPEATED TO VERIFY POST TRANSFUSION SPECIMEN    HCT 34.1 (L) 36.0 - 46.0 %   MCV 73.0 (L) 80.0 - 100.0 fL   MCH 21.8 (L) 26.0 - 34.0 pg   MCHC 29.9 (L) 30.0 - 36.0 g/dL   RDW 42.5 (H) 95.6 - 38.7 %   Platelets 361 150 - 400 K/uL   nRBC 0.0 0.0 - 0.2 %    Comment: Performed at [facility], [address] [place], [place] [address]  Pregnancy, urine POC     Status: None   Collection Time: 09/22/23  7:19 AM  Result Value Ref Range   Preg Test, Ur NEGATIVE NEGATIVE    Comment:        THE SENSITIVITY OF THIS METHODOLOGY IS >24 mIU/mL   Surgical pathology     Status: None   Collection Time: 09/22/23 11:03 AM  Result Value Ref Range   SURGICAL PATHOLOGY      SURGICAL PATHOLOGY CASE: [id number] PATIENT: Sally Price Surgical Pathology Report     Clinical  History: fibroid, menorrhagia with regular cycle (cm)     FINAL MICROSCOPIC DIAGNOSIS:  A. UTERUS AND CERVIX, WITH BILATERAL FALLOPIAN TUBES, HYSTERECTOMY: Cervix:           Unremarkable.           Negative for dysplasia or malignancy.       Endocervix:           Nabothian cysts.           Negative for hyperplasia, atypia or malignancy.       Endometrium:           Benign disordered proliferative endometrium.           Negative for hyperplasia, atypia or malignancy.       Myometrium:           Leiomyomata.           Negative for malignancy.       Serosa:  Unremarkable.           Negative for malignancy.       Bilateral fallopian tubes:           Benign fimbriated fallopian tubes.           Negative for malignancy.   GROSS DESCRIPTION:  Specimen: Received fresh is "cervix, uterus, bilateral fallopian tubes" Specimen integrity: Heavi ly disrupted/fragmented Size and shape: True anatomic shape cannot be determined due to fragmentation.  The fragments measure 19 x 17 x 6.8 cm in aggregate. Weight: The specimen weighs 692 g Serosa: There are portions of pink-tan, smooth serosa identified. Cervix: A detached fragment is consistent with cervix, measuring 3.5 cm in length by 3.0 cm in diameter with a 0.4 cm pinpoint external os.  The ectocervical mucosa is pink-tan, smooth, and glistening.  The stroma is tan and minimally roughened.  Sectioning reveals a pink-tan underlying cut surface, without distinct lesions. Endometrium: There are portions of pink-tan soft tissue, possibly consistent with endometrium identified, each with a thickness of 0.1 cm. No distinct lesions are identified within the possible endometrium. Myometrium: While fragmented, the myometrium is pink-tan with multiple white-tan, firm, well-defined, and whorled nodules ranging from 0.1 to 7.0 cm.  No further distinct lesions are identified  within the myometrium.  The myometrium has an average  thickness of 4.9 cm Fallopian tubes: There are 2 undesignated portions of tubular tissue measuring 4.1 and 5.6 cm in length, each measuring 0.7 cm in diameter. The external surfaces are dusky purple and intact, including possible attached fimbria.  Sectioning reveals patent lumens, without distinct lesions. Block Summary: A1-A2 cervix from opposite aspects A3-A4 possible endomyometrium A5 serosa A6 representative whorled nodules A7 longer tube A8 shorter tube (KW, 09/23/2023)  Final Diagnosis performed by Lance Coon, MD.   Electronically signed 09/26/2023 Technical and / or Professional components performed at [facility]. [facility], [address], [place], [place] [address].  Immunohistochemistry Technical component (if applicable) was performed at [facility]. [address], [place], [place] [address].   IMMUNOHISTOCHEMISTRY DISCLAIMER (if applicable): Some of these immunohis tochemical stains may have been developed and the performance characteristics determine by [facility]. Some may not have been cleared or approved by the U.S. Food and Drug Administration. The FDA has determined that such clearance or approval is not necessary. This test is used for clinical purposes. It should not be regarded as investigational or for research. This laboratory is certified under the Clinical Laboratory Improvement Amendments of 1988 (CLIA-88) as qualified to perform high complexity clinical laboratory testing.  The controls stained appropriately.   IHC stains are performed on formalin fixed, paraffin embedded tissue using a 3,3"diaminobenzidine (DAB) chromogen and Leica Bond Autostainer System. The staining intensity of the nucleus is score manually and is reported as the percentage of tumor cell nuclei demonstrating specific nuclear staining. The specimens are fixed in 10% Neutral Formalin for at least 6 hours and up to 72hrs. These tests a re  validated on decalcified tissue. Results should be interpreted with caution given the possibility of false negative results on decalcified specimens. Antibody Clones are as follows ER-clone 10F, PR-clone 16, Ki67- clone MM1. Some of these immunohistochemical stains may have been developed and the performance characteristics determined by [facility].      Treatments: surgery: Robotic assisted laparoscopic hysterectomy with bilateral salpingectomy... transfusion of 3 units pRBC 's prior to surgery   Discharge Exam: Blood pressure 138/78, pulse 92, temperature 98.6  F (37 C), temperature source Oral, resp. rate 15, height (P) 5\' 3"  (1.6 m), weight (P) 79.4 kg, last menstrual period 09/12/2023, SpO2 97%. General appearance: alert, cooperative, and no distress  Disposition: Discharge disposition: 01-Home or Self Care       Discharge Instructions     Call MD for:  persistant nausea and vomiting   Complete by: As directed    Call MD for:  redness, tenderness, or signs of infection (pain, swelling, redness, odor or green/yellow discharge around incision site)   Complete by: As directed    Call MD for:  severe uncontrolled pain   Complete by: As directed    Call MD for:  temperature >100.4   Complete by: As directed    Diet general   Complete by: As directed    Driving Restrictions   Complete by: As directed    Avoid driving for 1 week   Increase activity slowly   Complete by: As directed    Lifting restrictions   Complete by: As directed    Avoid lifting over 10 lbs for 6 weeks   May shower / Bathe   Complete by: As directed    May walk up steps   Complete by: As directed    No wound care   Complete by: As directed    Sexual Activity Restrictions   Complete by: As directed    Avoid sex for 6 weeks and until approved by Dr. Richardson Dopp      Allergies as of 09/22/2023   No Known Allergies      Medication List     TAKE these medications    acetaminophen 500 MG  tablet Commonly known as: TYLENOL Take 2 tablets (1,000 mg total) by mouth every 8 (eight) hours as needed for moderate pain (pain score 4-6).   calcium carbonate 500 MG chewable tablet Commonly known as: TUMS - dosed in mg elemental calcium Chew 1 tablet by mouth as needed for indigestion or heartburn.   ibuprofen 600 MG tablet Commonly known as: ADVIL Take 1 tablet (600 mg total) by mouth every 6 (six) hours as needed.   IRON PO Take by mouth daily. 2 gummies in am   oxyCODONE 5 MG immediate release tablet Commonly known as: Oxy IR/ROXICODONE Take 1-2 tablets (5-10 mg total) by mouth every 6 (six) hours as needed for moderate pain (pain score 4-6).   triamcinolone cream 0.5 % Commonly known as: KENALOG Apply 1 Application topically 2 (two) times daily as needed (eczema).        Follow-up Information     Gerald Leitz, MD. Schedule an appointment as soon as possible for a visit in 2 week(s).   Specialty: Obstetrics and Gynecology Why: Please schedule appointment for postoperative visit with Dr. Richardson Dopp in 2 weeks Contact information: [address] [place] [address] [phone]                 Signed: Gerald Leitz 09/22/2023, 4:31 PM

## 2023-09-22 NOTE — Op Note (Signed)
 09/22/2023  11:48 AM  PATIENT:  Sally Price  48 y.o. female  PRE-OPERATIVE DIAGNOSIS:  Fibroid Menorrhagia with regular cycle  POST-OPERATIVE DIAGNOSIS:  FibroidMenorrhagia with regular cycle  PROCEDURE:  Procedure(s) with comments: XI ROBOTIC ASSISTED LAPAROSCOPIC HYSTERECTOMY AND BILATERAL SALPINGECTOMY (Bilateral) - [facility]  SURGEON:  Surgeons and Role:    Gerald Leitz, MD - Primary    * Autry-Lott, Randa Evens, DO - Assisting  PHYSICIAN ASSISTANT:   ASSISTANTS: Dr. Randa Evens Autry-Lott An experienced assistant was required given the standard of surgical care given the complexity of the case.  This assistant was needed for exposure, dissection, suctioning, retraction, instrument exchange, assisting with delivery with administration of fundal pressure, and for overall help during the procedure.    ANESTHESIA:   general  EBL:  50 mL   BLOOD ADMINISTERED:none  DRAINS: Urinary Catheter (Foley)   LOCAL MEDICATIONS USED:  OTHER ropivicaine/ exparel with marcaine  SPECIMEN:  Source of Specimen:  uterus cervix and bilateral fallopian tubes   DISPOSITION OF SPECIMEN:  PATHOLOGY  COUNTS:  YES  TOURNIQUET:  * No tourniquets in log *  DICTATION: .Note written in EPIC  PLAN OF CARE: Admit for overnight observation  PATIENT DISPOSITION:  PACU - hemodynamically stable.   Delay start of Pharmacological VTE agent (>24hrs) due to surgical blood loss or risk of bleeding: not applicable  Findings: Normal external genitalia, vaginal mucosa and cervix. Enlarged fibroid uterus. Normal fallopian tubes and ovaries.   Procedure: The patient was taken to the operating room #6 [facility]  where she was placed under general anesthesia.Time out was performed. Marland Kitchen She was placed in dorsal lithotomy position and prepped and draped in the usual sterile fashion. A weighted speculum was placed into the vagina. A Deaver was placed anteriorly for retraction. The anterior lip of the cervix was  grasped with a single-tooth tenaculum. The vaginal mucosa was injected with 2.5 cc of ropivacaine at the 2/4/ 8 and 10 o'clock positions. The uterus was sounded to 10 cm. the cervix was dilated to 6 mm . 0 vicryl suture placed at the 12 oclock  positions Of the cervix to facilitate placement of a uterine manipulator. The manipulator was placed without difficulty. Weighted speculum and Deaver were removed .  Attention was turned to the patient's abdomen where a 5 mm trocar was placed 2 cm above the umbilicus. under direct visualization . The pneumoperitoneum was achieved with PCO2 gas. The laparoscope was removed. 50 cc of ropivacaine were injected into the abdominal cavity. The laparoscope was reinserted. An 8 mm trocar was placed in the right upper quadrant 16 centimeters from the umbilicus.later connected to robotic arm #4). An incision was made in the Right upper quadrant TROCAR WAS PLACED 8 cm from the umbilicus. Later connected to robotic arm #3. An 8 mm incision was made in the left upper quadrant 16 cm from the umbilicus and connected to robot arm #1. ( All incision sites were injected with 10cc of ropivacaine prior to port placement. )  Once all ports had been placed under direct visualization.  The laparoscope was removed and the da Vinci robotic system was thin right-sided docked. The robotic arms were connected to the corresponding trocars as listed above. The laparoscope was then reinserted. The long tip bipolar forceps were placed into port #1. The vessel sealer ( alternating with scissors)  placed in the port #3. A prograsp was placed in port #4. All instruments were directed into the pelvis under direct visualization.  Attention was turned to the surgeons console.. The left mesosalpinx and left utero-ovarian ligament was cauterized  and excised with vessel sealer  . The broad ligament was cauterized  and excised with vessel sealer . The round ligament was cauterized  and excised with vessel  sealer. The anterior leaf of broad ligament was incised along the bladder reflection to the midline.  The right mesosalpinx and left utero-ovarian ligament was cauterized  and excised with vessel sealer . The right broad ligament was cauterized was cauterized and excised with vessel sealer.  The right round ligament was cauterized  and excised with vessel sealer.  The broad ligament was incised to the midline. The bladder was dissected off the lower uterine segments of the cervix via sharp and blunt dissection. The uterine arteries were skeleton bilaterally. They were cauterized  and transected with the vessel sealer.  The KOH ring was identified. The anterior colpotomy was performed followed by the posterior colpotomy. Once the uterus,cervix and bilateral fallopian tubes were completely excised was removed through the vagina. The uterus was bivalved anterior to facilitate removal from the vagina.   The vaginal cuff was closed with running suture if 0 v-lock. The pelvis was irrigated. Marland KitchenMarland KitchenMarland KitchenExcellent hemostasis was noted. Arista was placed along the vaginal cuff.  All pelvic pedicles were examined and hemostasis was noted.  All instruments removed from the ports. All ports were removed under direct Visualization. The pneumoperitoneum was released. The skin incisions were closed with 4-0 Vicryl and then covered with Derma bond.     Sponge lap and needle counts weIre correct x 2. The patient was awakened from anesthesia and taken to the recovery room in stable condition.

## 2023-09-22 NOTE — Anesthesia Procedure Notes (Signed)
 Procedure Name: Intubation Date/Time: 09/22/2023 7:43 AM  Performed by: Little Ishikawa, CRNAPre-anesthesia Checklist: Patient identified, Emergency Drugs available, Suction available, Timeout performed and Patient being monitored Patient Re-evaluated:Patient Re-evaluated prior to induction Oxygen Delivery Method: Circle system utilized Preoxygenation: Pre-oxygenation with 100% oxygen Induction Type: IV induction Ventilation: Mask ventilation without difficulty and Oral airway inserted - appropriate to patient size Laryngoscope Size: Mac and 3 Grade View: Grade I Tube type: Oral Tube size: 7.0 mm Number of attempts: 1 Airway Equipment and Method: Stylet Placement Confirmation: ETT inserted through vocal cords under direct vision, positive ETCO2, CO2 detector and breath sounds checked- equal and bilateral Secured at: 23 cm Tube secured with: Tape Dental Injury: Teeth and Oropharynx as per pre-operative assessment

## 2023-09-22 NOTE — Plan of Care (Signed)

## 2023-09-22 NOTE — OR Nursing (Signed)
 Per hand-off by Florinda Marker, pregnancy test was done, negative result. She's unable to document in epic so she sent a paper result to the lab so they can manually record it.

## 2023-09-22 NOTE — H&P (Signed)
 Date of Initial H&P:09/21/2023  History reviewed, patient examined Hgb yesterday 7.0 she received 3 units prbc post transfusion hgb 10.2, stable for surgery.

## 2023-09-22 NOTE — Transfer of Care (Signed)
 Immediate Anesthesia Transfer of Care Note  Patient: Sally Foskey Ferner  Procedure(s) Performed: XI ROBOTIC ASSISTED LAPAROSCOPIC HYSTERECTOMY AND BILATERAL SALPINGECTOMY (Bilateral: Pelvis)  Patient Location: PACU  Anesthesia Type:General  Level of Consciousness: drowsy  Airway & Oxygen Therapy: Patient Spontanous Breathing and Patient connected to face mask oxygen  Post-op Assessment: Report given to RN and Post -op Vital signs reviewed and stable  Post vital signs: Reviewed and stable  Last Vitals:  Vitals Value Taken Time  BP 134/62 09/22/23 1153  Temp    Pulse 93 09/22/23 1155  Resp 31 09/22/23 1155  SpO2 100 % 09/22/23 1155  Vitals shown include unfiled device data.  Last Pain:  Vitals:   09/22/23 0656  TempSrc:   PainSc: 0-No pain         Complications: No notable events documented.

## 2023-09-22 NOTE — Progress Notes (Signed)
 Discharge instructions given to patient, all questions and concerns addressed, and patient verbalized understanding. Patient ambulatory, alert and oriented x4, and pain stable.

## 2023-09-22 NOTE — Anesthesia Postprocedure Evaluation (Signed)
 Anesthesia Post Note  Patient: Sally Price  Procedure(s) Performed: XI ROBOTIC ASSISTED LAPAROSCOPIC HYSTERECTOMY AND BILATERAL SALPINGECTOMY (Bilateral: Pelvis)     Patient location during evaluation: PACU Anesthesia Type: General Level of consciousness: sedated and patient cooperative Pain management: pain level controlled Vital Signs Assessment: post-procedure vital signs reviewed and stable Respiratory status: spontaneous breathing Cardiovascular status: stable Anesthetic complications: no   No notable events documented.  Last Vitals:  Vitals:   09/22/23 1500 09/22/23 1530  BP: (!) 114/50 138/78  Pulse: 87 92  Resp:  18  Temp:  36.9 C  SpO2: 97% 97%    Last Pain:  Vitals:   09/22/23 1644  TempSrc:   PainSc: 5                  Lewie Loron

## 2023-09-23 ENCOUNTER — Encounter (HOSPITAL_COMMUNITY): Payer: Self-pay | Admitting: Obstetrics and Gynecology

## 2023-09-23 LAB — POCT PREGNANCY, URINE: Preg Test, Ur: NEGATIVE

## 2023-09-26 LAB — SURGICAL PATHOLOGY

## 2024-02-21 ENCOUNTER — Other Ambulatory Visit (HOSPITAL_COMMUNITY): Payer: Self-pay | Admitting: Internal Medicine

## 2024-02-21 DIAGNOSIS — Z136 Encounter for screening for cardiovascular disorders: Secondary | ICD-10-CM

## 2024-03-23 ENCOUNTER — Ambulatory Visit (HOSPITAL_COMMUNITY): Admission: RE | Admit: 2024-03-23 | Source: Ambulatory Visit

## 2024-03-23 ENCOUNTER — Encounter (HOSPITAL_COMMUNITY): Payer: Self-pay

## 2024-05-14 ENCOUNTER — Telehealth: Payer: Self-pay | Admitting: *Deleted

## 2024-05-14 NOTE — Telephone Encounter (Signed)
 Spoke with the patient regarding the referral to GYN oncology. Patient scheduled as new patient with Dr Viktoria on 10/31 at 9 am. Patient given an arrival time of 8:30 am.  Explained to the patient the the doctor will perform a pelvic exam at this visit. Patient given the policy that only one visitor allowed and that visitor must be over 16 yrs are allowed in the Cancer Center. Patient given the address/phone number for the clinic and that the center offers free valet service. Patient aware that masks optional.

## 2024-05-18 ENCOUNTER — Ambulatory Visit (HOSPITAL_COMMUNITY)
Admission: RE | Admit: 2024-05-18 | Discharge: 2024-05-18 | Disposition: A | Discharge status: Home / Self Care | Payer: Self-pay | Source: Ambulatory Visit | Attending: Internal Medicine | Admitting: Internal Medicine

## 2024-05-18 DIAGNOSIS — Z136 Encounter for screening for cardiovascular disorders: Secondary | ICD-10-CM | POA: Insufficient documentation

## 2024-05-22 ENCOUNTER — Encounter: Payer: Self-pay | Admitting: Gynecologic Oncology

## 2024-05-23 NOTE — Progress Notes (Unsigned)
 GYNECOLOGIC ONCOLOGY NEW PATIENT CONSULTATION   Patient Name: Sally Price   Patient Age: 48 y.o. Date of Service: 05/25/24 Referring Provider: Rojean Adolph Lindau, DO  Primary Care Provider: Vernon Velna SAUNDERS, MD Consulting Provider: Comer Dollar, MD   Assessment/Plan:  Patient was complex adnexal cyst, cyclic vaginal discharge likely related to hormonal cycles.   We discussed recent imaging with her OB/GYN.  Unfortunately, I am unable to see these ultrasound reports.  They were initially to small complex cysts within the left adnexa and most recently a third seen.  Based on description, it sounds like these masses are mostly cystic with some internal echoes.  We discussed possible etiologies.  Recent CA125 was performed and normal.  I stressed that this is not diagnostic and can be normal in the setting of early stage ovarian cancer and can be elevated in many other disease processes.  Ultimately, I recommended some additional imaging.  We reviewed the benefits of repeating an ultrasound here at the hospital versus proceeding with MRI.  Ultimately, given ease of obtaining the ultrasound and cost difference, I recommend that we start with an ultrasound.  If there are features that need further evaluation, we will proceed with pelvic MRI.  While there is not clearly cervical tissue on her exam today, I discussed that if there is some cervical or endometrial tissue left in situ at the time of hysterectomy that this can result in cyclic bleeding at the time of the menstrual cycle.  I suggested both from the standpoint of her adnexal cysts as well as the bleeding that we try some low-dose progesterone suppression.  While this may not prevent ovulation, my hope is that we can modulate her hormones to help prevent some of her cyclic symptoms.  She and I will have a phone after her ultrasound to discuss findings and whether any additional imaging is necessary.  If ultrasound findings raise  increased concern for borderline process or malignancy, we may discuss proceeding with surgery.  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  She notes having had a workup with her primary care provider that did not reveal a cause for this.  We discussed that the surgery itself is unlikely to have led to her hypertension.  There can be some changes to blood pressure related to hormonal changes but I would not expect this given that her ovaries were left in situ.  Because she was chronically anemic prior to the surgery, it is possible that she developed some hypertension with improvement in her anemia.  I suspect that some of her left-sided pelvic pain may be related to constipation.  I encouraged her to use MiraLAX daily for the next few weeks to see if she has any change in symptoms.  A copy of this note was sent to the patient's referring provider.   75 minutes of total time was spent for this patient encounter, including preparation, face-to-face counseling with the patient and coordination of care, and documentation of the encounter.  Comer Dollar, MD  Division of Gynecologic Oncology  Department of Obstetrics and Gynecology  [facility]  [facility]  ___________________________________________  Chief Complaint: Chief Complaint  Patient presents with   Cyst of ovary, unspecified laterality    History of Present Illness:  Sally Price  is a 48 y.o. y.o. female who is seen in consultation at the request of Dr. Lindau for an evaluation of complex adnexal cyst.  Patient was followed for history of abnormal bleeding and  fibroids as well as dysmenorrhea.  She also had a history of chronic blood loss anemia.  She ultimately underwent robotic total hysterectomy with bilateral salpingectomy in late February 2025.  Pathology from her surgery revealed benign disordered proliferative endometrium.  Some leiomyoma were noted within the myometrium.    Patient seen in July with brown discharge and abdominal pain.  She described this as monthly episodes of brown discharge similar to the onset of her menses, lasting for a few days.  Episodes are associated with cramping, breast soreness, and bloating. 02/16/2024: Pelvic ultrasound at [facility] shows right ovary measures 3.4 x 2.4 x 2.6 cm, left 3.9 x 3.1 x 3.2 cm.  2 complex cyst noted in the left ovary, the larger measuring up to 1.7 cm.  Both avascular.  Small-moderate free fluid between both ovaries. 04/27/2024: Pelvic ultrasound at [facility] shows right ovary measures 3.2 x 2.3 x 2.2 cm.  Left measures 3.7 x 3.5 x 2.7 cm.  Left ovary noted to contain 3 complex cyst with internal echoes, the largest measuring 2.4 cm.  2 of the 3 cysts were previously seen and have increased in size, small amount of free fluid adjacent to the left ovary. CA125 was obtained and 22.5.  Patient comes in by herself today.  Since surgery, she describes having cyclic symptoms.  This includes lower abdominal pain and bloating, aching in her vagina, breast tenderness, and brown discharge that requires the use of a liner.  The symptoms last for approximately 5-6 days.  First time this happened was approximately 6 weeks after surgery.  Outside of the cyclic periods, she endorses intermittent left pelvic pain and pain at her right mid abdominal incision.  She sometimes has to take the Tylenol  for this pain.  She describes both as soreness and the pain at the incision site as pulling sometimes.  She reports bowel function daily but has some constipation since surgery, intermittently takes MiraLAX.  Notes that her stool is hard.  Has urinary urgency since surgery, somewhat improved but still experiencing symptoms.  Also endorses pain within her bladder when she needs to urinate intermittently.  PAST MEDICAL HISTORY:  Past Medical History:  Diagnosis Date   Dysmenorrhea    Eczema    GERD (gastroesophageal reflux disease)     History of abnormal cervical Pap smear 12/24/2012   Iron deficiency anemia secondary to blood loss (chronic)    Menorrhagia with regular cycle    PCOS (polycystic ovarian syndrome)    Severe anemia    (09-01-2023  pt denies any symptoms) ED visit 01-23-2023 w/ severe anemia, Hg 4.3, transfused 2 U/ PRBC pt was sent by GYN office;   06-07-2023 direct admit from GYN office , Hg 4.1,  transfused 2U, Hg 6.4,  transfused 1U,  Hg 7.6, pt d/c'd;   08-26-2023 pt sent to [facility] from GYN, Hg 5.1,  transfused 2U,  checked Hg after infusion Hg 7.6   Uterine fibroid      PAST SURGICAL HISTORY:  Past Surgical History:  Procedure Laterality Date   HYSTEROSCOPY WITH D & C  05/13/2011   Procedure: DILATATION AND CURETTAGE (D&C) /HYSTEROSCOPY;  Surgeon: Charlie JINNY Flowers, MD;  Location: [facility];  Service: Gynecology;;   HYSTEROSCOPY WITH D & C  01/30/2007   @ [facility] by dr fore   IR RADIOLOGIST EVAL & MGMT  04/12/2022   ROBOTIC ASSISTED LAPAROSCOPIC HYSTERECTOMY AND SALPINGECTOMY Bilateral 09/22/2023   Procedure: XI ROBOTIC ASSISTED LAPAROSCOPIC HYSTERECTOMY AND BILATERAL SALPINGECTOMY;  Surgeon: Rosalva Sawyer, MD;  Location[facility];  Service: Gynecology;  Laterality: Bilateral;  [facility]    OB/GYN HISTORY:  OB History  Gravida Para Term Preterm AB Living  0 0 0 0 0 0  SAB IAB Ectopic Multiple Live Births  0 0 0 0     No LMP recorded.  Age at menarche: 43  Age at menopause: 52 Hx of HRT: denies Hx of STDs: denies Last pap: 2024 History of abnormal pap smears: denies  SCREENING STUDIES:  Last mammogram: 2023  Last colonoscopy: has not had  MEDICATIONS: Outpatient Encounter Medications as of 05/25/2024  Medication Sig   acetaminophen  (TYLENOL ) 500 MG tablet Take 2 tablets (1,000 mg total) by mouth every 8 (eight) hours as needed for moderate pain (pain score 4-6).   calcium  carbonate (TUMS - DOSED IN MG ELEMENTAL CALCIUM ) 500 MG chewable tablet Chew 1 tablet by mouth as needed for  indigestion or heartburn.   Ferrous Sulfate (IRON PO) Take by mouth daily. 2 gummies in am   ibuprofen  (ADVIL ) 600 MG tablet Take 1 tablet (600 mg total) by mouth every 6 (six) hours as needed.   losartan (COZAAR) 50 MG tablet Take 50 mg by mouth daily.   norethindrone (MICRONOR) 0.35 MG tablet Take 1 tablet (0.35 mg total) by mouth daily.   oxyCODONE  (OXY IR/ROXICODONE ) 5 MG immediate release tablet Take 1-2 tablets (5-10 mg total) by mouth every 6 (six) hours as needed for moderate pain (pain score 4-6).   triamcinolone cream (KENALOG) 0.5 % Apply 1 Application topically 2 (two) times daily as needed (eczema).   No facility-administered encounter medications on file as of 05/25/2024.    ALLERGIES:  No Known Allergies   FAMILY HISTORY:  Family History  Problem Relation Age of Onset   Lung cancer Maternal Grandmother    Brain cancer Maternal Grandfather    Prostate cancer Maternal Uncle      SOCIAL HISTORY:  Social Connections: Unknown (11/27/2021)   Received from [facility]   Social Network    Social Network: Not on file    REVIEW OF SYSTEMS:  + Abdominal distention, vaginal discharge Denies appetite changes, fevers, chills, fatigue, unexplained weight changes. Denies hearing loss, neck lumps or masses, mouth sores, ringing in ears or voice changes. Denies cough or wheezing.  Denies shortness of breath. Denies chest pain or palpitations. Denies leg swelling. Denies blood in stools, constipation, diarrhea, nausea, vomiting, or early satiety. Denies pain with intercourse, dysuria, frequency, hematuria or incontinence. Denies hot flashes, pelvic pain, vaginal bleeding.   Denies joint pain, back pain or muscle pain/cramps. Denies itching, rash, or wounds. Denies dizziness, headaches, numbness or seizures. Denies swollen lymph nodes or glands, denies easy bruising or bleeding. Denies anxiety, depression, confusion, or decreased concentration.  Physical Exam:  Vital Signs for  this encounter:  Blood pressure 139/68, pulse 86, temperature 99.2 F (37.3 C), temperature source Oral, resp. rate 19, height 5' 3 (1.6 m), weight 186 lb 6.4 oz (84.6 kg), SpO2 100%. Body mass index is 33.02 kg/m. General: Alert, oriented, no acute distress.  HEENT: Normocephalic, atraumatic. Sclera anicteric.  Chest: Clear to auscultation bilaterally. No wheezes, rhonchi, or rales. Cardiovascular: Regular rate and rhythm, no murmurs, rubs, or gallops.  Abdomen: Normoactive bowel sounds. Soft, nondistended, diffuse mild tenderness to palpation. No masses or hepatosplenomegaly appreciated. No palpable fluid wave.  Extremities: Grossly normal range of motion. Warm, well perfused. No edema bilaterally.  Skin: No rashes or lesions.  Lymphatics: No cervical, supraclavicular,  or inguinal adenopathy.  GU:  Normal external female genitalia. No lesions. No discharge or bleeding.             Bladder/urethra:  No lesions or masses, well supported bladder             Vagina: Well-rugated.  Small amount of old blood at the vaginal apex.  Increased rugation's noted at the vaginal apex although no discernible cervical tissue.  No masses or nodularity on palpation.  Stool felt within the rectum on vaginal exam.             Cervix/uterus: surgically absent.             Adnexa: No masses appreciated.  Rectal: Deferred.  LABORATORY AND RADIOLOGIC DATA:  Outside medical records were reviewed to synthesize the above history, along with the history and physical obtained during the visit.   Lab Results  Component Value Date   WBC 7.4 09/22/2023   HGB 10.2 (L) 09/22/2023   HCT 34.1 (L) 09/22/2023   PLT 361 09/22/2023   GLUCOSE 83 01/23/2023   ALT 17 06/04/2021   AST 17 06/04/2021   NA 137 01/23/2023   K 3.3 (L) 01/23/2023   CL 109 01/23/2023   CREATININE 0.54 01/23/2023   BUN 6 01/23/2023   CO2 20 (L) 01/23/2023

## 2024-05-25 ENCOUNTER — Inpatient Hospital Stay: Attending: Gynecologic Oncology | Admitting: Gynecologic Oncology

## 2024-05-25 ENCOUNTER — Encounter: Payer: Self-pay | Admitting: Gynecologic Oncology

## 2024-05-25 ENCOUNTER — Telehealth: Payer: Self-pay

## 2024-05-25 VITALS — BP 139/68 | HR 86 | Temp 99.2°F | Resp 19 | Ht 63.0 in | Wt 186.4 lb

## 2024-05-25 DIAGNOSIS — K219 Gastro-esophageal reflux disease without esophagitis: Secondary | ICD-10-CM | POA: Insufficient documentation

## 2024-05-25 DIAGNOSIS — Z808 Family history of malignant neoplasm of other organs or systems: Secondary | ICD-10-CM | POA: Diagnosis not present

## 2024-05-25 DIAGNOSIS — Z801 Family history of malignant neoplasm of trachea, bronchus and lung: Secondary | ICD-10-CM | POA: Diagnosis not present

## 2024-05-25 DIAGNOSIS — Z8042 Family history of malignant neoplasm of prostate: Secondary | ICD-10-CM | POA: Diagnosis not present

## 2024-05-25 DIAGNOSIS — Z9079 Acquired absence of other genital organ(s): Secondary | ICD-10-CM | POA: Insufficient documentation

## 2024-05-25 DIAGNOSIS — Z90710 Acquired absence of both cervix and uterus: Secondary | ICD-10-CM | POA: Insufficient documentation

## 2024-05-25 DIAGNOSIS — Z79899 Other long term (current) drug therapy: Secondary | ICD-10-CM | POA: Diagnosis not present

## 2024-05-25 DIAGNOSIS — I10 Essential (primary) hypertension: Secondary | ICD-10-CM | POA: Diagnosis not present

## 2024-05-25 DIAGNOSIS — R102 Pelvic and perineal pain unspecified side: Secondary | ICD-10-CM | POA: Insufficient documentation

## 2024-05-25 DIAGNOSIS — N898 Other specified noninflammatory disorders of vagina: Secondary | ICD-10-CM | POA: Diagnosis not present

## 2024-05-25 DIAGNOSIS — N83209 Unspecified ovarian cyst, unspecified side: Secondary | ICD-10-CM

## 2024-05-25 DIAGNOSIS — N939 Abnormal uterine and vaginal bleeding, unspecified: Secondary | ICD-10-CM | POA: Diagnosis not present

## 2024-05-25 DIAGNOSIS — L309 Dermatitis, unspecified: Secondary | ICD-10-CM | POA: Diagnosis not present

## 2024-05-25 DIAGNOSIS — D398 Neoplasm of uncertain behavior of other specified female genital organs: Secondary | ICD-10-CM | POA: Diagnosis present

## 2024-05-25 MED ORDER — NORETHINDRONE 0.35 MG PO TABS: 1.0000 | ORAL_TABLET | Freq: Every day | ORAL | 11 refills | Status: AC

## 2024-05-25 NOTE — Patient Instructions (Addendum)
 It was very nice to meet you today.  We discussed the symptoms you are having as well as the small cyst seen on your left ovary the last several months.  To get more information about the cyst, I have ordered a pelvic ultrasound.  If findings on the ultrasound raise additional concern, we will discuss need to schedule surgery or get some additional imaging with a pelvic MRI.  To hopefully help control some of the other symptoms that you are feeling on a monthly cycle, I am sending in a low-dose progesterone pill.  This dose may or may not completely control the symptoms, but I would like to start with the lowest dose to make sure that you tolerate it.  You have some improvement but not complete improvement, we can always increase the dose of this medication.  In terms of your constipation, please start taking MiraLAX daily with a goal of having at least 1 bowel movement daily that is soft and does not require straining.

## 2024-05-25 NOTE — Telephone Encounter (Signed)
 Per Dr.Tucker request, a medical records release was faxed to [facility], Dr.Pahwani for recent B/P work-up.

## 2024-05-28 ENCOUNTER — Encounter: Payer: Self-pay | Admitting: Family Medicine

## 2024-05-29 ENCOUNTER — Ambulatory Visit (HOSPITAL_COMMUNITY)
Admission: RE | Admit: 2024-05-29 | Discharge: 2024-05-29 | Disposition: A | Discharge status: Home / Self Care | Source: Ambulatory Visit | Attending: Gynecologic Oncology | Admitting: Gynecologic Oncology

## 2024-05-29 DIAGNOSIS — N83209 Unspecified ovarian cyst, unspecified side: Secondary | ICD-10-CM | POA: Diagnosis present

## 2024-06-01 ENCOUNTER — Encounter: Payer: Self-pay | Admitting: Gynecologic Oncology

## 2024-06-01 ENCOUNTER — Inpatient Hospital Stay: Attending: Gynecologic Oncology | Admitting: Gynecologic Oncology

## 2024-06-01 DIAGNOSIS — N83201 Unspecified ovarian cyst, right side: Secondary | ICD-10-CM | POA: Diagnosis not present

## 2024-06-01 DIAGNOSIS — N898 Other specified noninflammatory disorders of vagina: Secondary | ICD-10-CM

## 2024-06-01 DIAGNOSIS — R102 Pelvic and perineal pain unspecified side: Secondary | ICD-10-CM

## 2024-06-01 DIAGNOSIS — N83209 Unspecified ovarian cyst, unspecified side: Secondary | ICD-10-CM

## 2024-06-01 DIAGNOSIS — N83202 Unspecified ovarian cyst, left side: Secondary | ICD-10-CM | POA: Diagnosis not present

## 2024-06-01 NOTE — Progress Notes (Signed)
 Gynecologic Oncology Telehealth Note: Gyn-Onc  I connected with Sally Price  on 06/01/24 at  4:45 PM EST by telephone and verified that I am speaking with the correct person using two identifiers.  I discussed the limitations, risks, security and privacy concerns of performing an evaluation and management service by telemedicine and the availability of in-person appointments. I also discussed with the patient that there may be a patient responsible charge related to this service. The patient expressed understanding and agreed to proceed.  Other persons participating in the visit and their role in the encounter: none.  Patient's location: home Provider's location: [facility] [place]  Reason for Visit: follow-up  Treatment History: Patient was followed for history of abnormal bleeding and fibroids as well as dysmenorrhea.  She also had a history of chronic blood loss anemia.  She ultimately underwent robotic total hysterectomy with bilateral salpingectomy in late February 2025.  Pathology from her surgery revealed benign disordered proliferative endometrium.  Some leiomyoma were noted within the myometrium.   Patient seen in July with brown discharge and abdominal pain.  She described this as monthly episodes of brown discharge similar to the onset of her menses, lasting for a few days.  Episodes are associated with cramping, breast soreness, and bloating. 02/16/2024: Pelvic ultrasound at [facility] shows right ovary measures 3.4 x 2.4 x 2.6 cm, left 3.9 x 3.1 x 3.2 cm.  2 complex cyst noted in the left ovary, the larger measuring up to 1.7 cm.  Both avascular.  Small-moderate free fluid between both ovaries. 04/27/2024: Pelvic ultrasound at [facility] shows right ovary measures 3.2 x 2.3 x 2.2 cm.  Left measures 3.7 x 3.5 x 2.7 cm.  Left ovary noted to contain 3 complex cyst with internal echoes, the largest measuring 2.4 cm.  2 of the 3 cysts were previously seen and have increased in size, small  amount of free fluid adjacent to the left ovary. CA125 was obtained and 22.5.   Patient comes in by herself today.  Since surgery, she describes having cyclic symptoms.  This includes lower abdominal pain and bloating, aching in her vagina, breast tenderness, and brown discharge that requires the use of a liner.  The symptoms last for approximately 5-6 days.  First time this happened was approximately 6 weeks after surgery.  Outside of the cyclic periods, she endorses intermittent left pelvic pain and pain at her right mid abdominal incision.  She sometimes has to take the Tylenol  for this pain.  She describes both as soreness and the pain at the incision site as pulling sometimes.  Interval History: Doing well.  Has used the MiraLAX with some bowel function but still feels like she has not had good emptying of her bowels.  Having some sensation of pressure on her bladder, different than what she has felt typically with a urinary tract infection, wonders if this is her bowels.  Has picked up the progesterone but has not started it.  Past Medical/Surgical History: Past Medical History:  Diagnosis Date   Dysmenorrhea    Eczema    GERD (gastroesophageal reflux disease)    History of abnormal cervical Pap smear 12/24/2012   Iron deficiency anemia secondary to blood loss (chronic)    Menorrhagia with regular cycle    PCOS (polycystic ovarian syndrome)    Severe anemia    (09-01-2023  pt denies any symptoms) ED visit 01-23-2023 w/ severe anemia, Hg 4.3, transfused 2 U/ PRBC pt was sent by GYN office;  06-07-2023 direct admit from GYN office , Hg 4.1,  transfused 2U, Hg 6.4,  transfused 1U,  Hg 7.6, pt d/c'd;   08-26-2023 pt sent to [facility] from GYN, Hg 5.1,  transfused 2U,  checked Hg after infusion Hg 7.6   Uterine fibroid     Past Surgical History:  Procedure Laterality Date   HYSTEROSCOPY WITH D & C  05/13/2011   Procedure: DILATATION AND CURETTAGE (D&C) /HYSTEROSCOPY;  Surgeon:  Charlie JINNY Flowers, MD;  Location: [facility];  Service: Gynecology;;   HYSTEROSCOPY WITH D & C  01/30/2007   @ [facility] by dr fore   IR RADIOLOGIST EVAL & MGMT  04/12/2022   ROBOTIC ASSISTED LAPAROSCOPIC HYSTERECTOMY AND SALPINGECTOMY Bilateral 09/22/2023   Procedure: XI ROBOTIC ASSISTED LAPAROSCOPIC HYSTERECTOMY AND BILATERAL SALPINGECTOMY;  Surgeon: Rosalva Sawyer, MD;  Location: [facility];  Service: Gynecology;  Laterality: Bilateral;  [facility]    Family History  Problem Relation Age of Onset   Lung cancer Maternal Grandmother    Brain cancer Maternal Grandfather    Prostate cancer Maternal Uncle     Social History   Socioeconomic History   Marital status: Single    Spouse name: Not on file   Number of children: Not on file   Years of education: Not on file   Highest education level: Not on file  Occupational History   Not on file  Tobacco Use   Smoking status: Every Day    Current packs/day: 0.50    Average packs/day: 0.5 packs/day for 10.0 years (5.0 ttl pk-yrs)    Types: Cigarettes   Smokeless tobacco: Never   Tobacco comments:    09-01-2023  pt stated smokes 6-7 cig per day,  started age 66 (55 yrs)  Vaping Use   Vaping status: Never Used  Substance and Sexual Activity   Alcohol use: Not Currently    Comment: occasionally   Drug use: Never   Sexual activity: Yes    Birth control/protection: None  Other Topics Concern   Not on file  Social History Narrative   Not on file   Social Drivers of Health   Financial Resource Strain: Not on file  Food Insecurity: No Food Insecurity (09/21/2023)   Hunger Vital Sign    Worried About Running Out of Food in the Last Year: Never true    Ran Out of Food in the Last Year: Never true  Transportation Needs: No Transportation Needs (09/21/2023)   PRAPARE - Administrator, Civil Service (Medical): No    Lack of Transportation (Non-Medical): No  Physical Activity: Not on file  Stress: Not on file  Social Connections: Unknown  (11/27/2021)   Received from [facility]   Social Network    Social Network: Not on file    Current Medications:  Current Outpatient Medications:    acetaminophen  (TYLENOL ) 500 MG tablet, Take 2 tablets (1,000 mg total) by mouth every 8 (eight) hours as needed for moderate pain (pain score 4-6)., Disp: 30 tablet, Rfl: 0   calcium  carbonate (TUMS - DOSED IN MG ELEMENTAL CALCIUM ) 500 MG chewable tablet, Chew 1 tablet by mouth as needed for indigestion or heartburn., Disp: , Rfl:    Ferrous Sulfate (IRON PO), Take by mouth daily. 2 gummies in am, Disp: , Rfl:    ibuprofen  (ADVIL ) 600 MG tablet, Take 1 tablet (600 mg total) by mouth every 6 (six) hours as needed., Disp: 30 tablet, Rfl: 1   losartan (COZAAR) 50 MG tablet, Take  50 mg by mouth daily., Disp: , Rfl:    norethindrone (MICRONOR) 0.35 MG tablet, Take 1 tablet (0.35 mg total) by mouth daily., Disp: 28 tablet, Rfl: 11   oxyCODONE  (OXY IR/ROXICODONE ) 5 MG immediate release tablet, Take 1-2 tablets (5-10 mg total) by mouth every 6 (six) hours as needed for moderate pain (pain score 4-6)., Disp: 20 tablet, Rfl: 0   triamcinolone cream (KENALOG) 0.5 %, Apply 1 Application topically 2 (two) times daily as needed (eczema)., Disp: , Rfl:   Review of Symptoms: Pertinent positives as per HPI.  Physical Exam: Deferred given limitations of phone visit.  Laboratory & Radiologic Studies: Pelvic ultrasound 05/29/24: 1. Three similar subjacent hypoechoic left ovarian masses up to 3.0 cm without internal vascularity, all new from 2024 sonogram, differential includes hemorrhagic cysts versus endometriomas. Recommend ultrasound follow-up in 612 weeks. 2. Simple right ovarian follicle measuring 1.5 x 1.1 x 1.3 cm. No follow-up imaging recommended.  Assessment & Plan: Sally Price  is a 48 y.o. woman with bilateral ovarian cysts that appear consistent with benign cyst on recent ultrasound, normal CA125.  Discussed ultrasound findings which  are overall very reassuring.  She has not started oral progesterone yet.  My recommendation is that we try this for 3 months and repeat an ultrasound.  My hope is that it will help suppress her ovaries and may help decrease the size of her current cysts.  I also suspect that having her on some progesterone may help some of the other cyclic symptoms she is having, notably the discharge/old blood on a monthly cycle.  Reviewed her bowel function.  Gave some other suggestions including milk of magnesia with prune juice to help clean out her bowels.  Also discussed trying a diet that will help produce less gas (she was told at her recent ultrasound that her bowels were full of gas).  With regard to her hypertension, discussed that given chronic history of anemia, her blood pressure may be in part explained by the quick improvement and resolution of her anemia.  I discussed the assessment and treatment plan with the patient. The patient was provided with an opportunity to ask questions and all were answered. The patient agreed with the plan and demonstrated an understanding of the instructions.   The patient was advised to call back or see an in-person evaluation if the symptoms worsen or if the condition fails to improve as anticipated.   12 minutes of total time was spent for this patient encounter, including preparation, phone counseling with the patient and coordination of care, and documentation of the encounter.   Comer Dollar, MD  Division of Gynecologic Oncology  Department of Obstetrics and Gynecology  [facility]

## 2024-06-05 ENCOUNTER — Encounter: Payer: Self-pay | Admitting: Family Medicine

## 2024-06-05 ENCOUNTER — Telehealth: Payer: Self-pay | Admitting: *Deleted

## 2024-06-05 NOTE — Telephone Encounter (Signed)
 Per Dr Viktoria called and [facility] for the patient with the appt date/time of 08/31/24 at 2 pm. Patient to arrive at 1:45 pm with a full bladder. Ask that the patient called the office back to acknowledge she received the message.

## 2024-08-31 ENCOUNTER — Ambulatory Visit (HOSPITAL_COMMUNITY): Admission: RE | Admit: 2024-08-31 | Source: Ambulatory Visit

## 2024-08-31 DIAGNOSIS — N83209 Unspecified ovarian cyst, unspecified side: Secondary | ICD-10-CM
